# Patient Record
Sex: Male | Born: 1965 | Race: White | Hispanic: No | Marital: Married | Smoking: Never smoker
Health system: Southern US, Community
[De-identification: ages and names within clinical notes are randomized; demographics above are authoritative.]

## PROBLEM LIST (undated history)

## (undated) DIAGNOSIS — J45909 Unspecified asthma, uncomplicated: Secondary | ICD-10-CM

## (undated) DIAGNOSIS — J329 Chronic sinusitis, unspecified: Secondary | ICD-10-CM

## (undated) HISTORY — PX: SINOSCOPY: SHX187

## (undated) HISTORY — DX: Chronic sinusitis, unspecified: J32.9

## (undated) HISTORY — DX: Unspecified asthma, uncomplicated: J45.909

---

## 2015-08-02 ENCOUNTER — Ambulatory Visit: Payer: Self-pay | Admitting: Family Medicine

## 2015-10-18 ENCOUNTER — Ambulatory Visit (INDEPENDENT_AMBULATORY_CARE_PROVIDER_SITE_OTHER): Payer: Worker's Compensation | Admitting: Allergy and Immunology

## 2015-10-18 ENCOUNTER — Encounter: Payer: Self-pay | Admitting: Allergy and Immunology

## 2015-10-18 VITALS — BP 148/96 | HR 84 | Temp 97.9°F | Resp 16 | Ht 68.98 in | Wt 210.4 lb

## 2015-10-18 DIAGNOSIS — J339 Nasal polyp, unspecified: Secondary | ICD-10-CM | POA: Diagnosis not present

## 2015-10-18 DIAGNOSIS — H1010 Acute atopic conjunctivitis, unspecified eye: Secondary | ICD-10-CM | POA: Diagnosis not present

## 2015-10-18 DIAGNOSIS — J4541 Moderate persistent asthma with (acute) exacerbation: Secondary | ICD-10-CM

## 2015-10-18 DIAGNOSIS — J309 Allergic rhinitis, unspecified: Secondary | ICD-10-CM | POA: Diagnosis not present

## 2015-10-18 MED ORDER — FLUTICASONE FUROATE-VILANTEROL 200-25 MCG/INH IN AEPB
1.0000 | INHALATION_SPRAY | Freq: Every day | RESPIRATORY_TRACT | 5 refills | Status: DC
Start: 2015-10-18 — End: 2016-06-10

## 2015-10-18 MED ORDER — UMECLIDINIUM BROMIDE 62.5 MCG/INH IN AEPB: 1.0000 | INHALATION_SPRAY | Freq: Every day | RESPIRATORY_TRACT | 5 refills | Status: DC

## 2015-10-18 MED ORDER — MONTELUKAST SODIUM 10 MG PO TABS: 10.0000 mg | ORAL_TABLET | Freq: Every day | ORAL | 5 refills | Status: DC

## 2015-10-18 MED ORDER — MOMETASONE FUROATE 50 MCG/ACT NA SUSP: NASAL | 5 refills | Status: DC

## 2015-10-18 MED ORDER — ALBUTEROL SULFATE HFA 108 (90 BASE) MCG/ACT IN AERS: INHALATION_SPRAY | RESPIRATORY_TRACT | 1 refills | Status: DC

## 2015-10-18 NOTE — Progress Notes (Signed)
NEW PATIENT NOTE  Referring Provider: No ref. provider found Primary Provider: Hadley Pen, MD Date of office visit: 10/18/2015    Subjective:   Chief Complaint:  Nicolas Willis (DOB: 01-26-1966) is a 50 y.o. male with a chief complaint of Wheezing and Sinusitis  who presents to the clinic on 10/18/2015 with the following problems:  HPI: Nicolas Willis presents to this clinic in evaluation of respiratory tract problems. He has a long history of asthma and allergic rhinoconjunctivitis and chronic sinusitis and nasal polyps. He underwent a polypectomy and functional endoscopic sinus surgery in 2015 with a ear nose and throat doctor in [facility]. This helped for approximately 2 years but since working in a moldy environment in July 2016 he's had problems with both his head and chest. He has nasal congestion and stuffiness of his head and sneezing and headaches and coughing and wheezing and shortness of breath. When he exercises he has difficulty because of these issues. He's been treated with steroids about every other month which does help. His last steroid administration was about 3 weeks ago. He has been using Symbicort on a regular basis. He continues on Singulair on a regular basis.  Past Medical History:  Diagnosis Date  . Frequent sinus infections     Past Surgical History:  Procedure Laterality Date  . SINOSCOPY        Medication List      SYMBICORT 160-4.5 MCG/ACT inhaler Generic drug:  budesonide-formoterol Inhale 2 puffs into the lungs 2 (two) times daily.       No Known Allergies  Review of systems negative except as noted in HPI / PMHx or noted below:  Review of Systems  Constitutional: Negative.   HENT: Negative.   Eyes: Negative.   Respiratory: Negative.   Cardiovascular: Negative.   Gastrointestinal: Negative.   Genitourinary: Negative.   Musculoskeletal: Negative.   Skin: Negative.   Neurological: Negative.   Endo/Heme/Allergies: Negative.     Psychiatric/Behavioral: Negative.     Family History  Problem Relation Age of Onset  . Leukemia Mother   . Heart disease Maternal Grandmother   . Heart disease Maternal Grandfather     Social History   Social History  . Marital status: Married    Spouse name: N/A  . Number of children: N/A  . Years of education: N/A   Occupational History  . Not on file.   Social History Main Topics  . Smoking status: Never Smoker  . Smokeless tobacco: Never Used  . Alcohol use No  . Drug use: No  . Sexual activity: Not on file   Other Topics Concern  . Not on file   Social History Narrative  . No narrative on file    Environmental and Social history  Lives in a house with a dry environment, a dog located inside the household, carpeting in the bedroom, no plastic on the bed or pillow, and no smoking ongoing with inside the household. He is a Emergency planning/management officer.   Objective:   Vitals:   10/18/15 0856  BP: (!) 148/96  Pulse: 84  Resp: 16  Temp: 97.9 F (36.6 C)   Height: 5' 8.98" (175.2 cm) Weight: 210 lb 6.4 oz (95.4 kg)  Physical Exam  Constitutional: He is well-developed, well-nourished, and in no distress.  HENT:  Head: Normocephalic.  Right Ear: Tympanic membrane, external ear and ear canal normal.  Left Ear: Tympanic membrane, external ear and ear canal normal.  Nose: Mucosal edema (Possible  polyp formation deep in left nasal airway) present. No rhinorrhea.  Mouth/Throat: Uvula is midline, oropharynx is clear and moist and mucous membranes are normal. No oropharyngeal exudate.  Eyes: Conjunctivae are normal.  Neck: Trachea normal. No tracheal tenderness present. No tracheal deviation present. No thyromegaly present.  Cardiovascular: Normal rate, regular rhythm, S1 normal, S2 normal and normal heart sounds.   No murmur heard. Pulmonary/Chest: No stridor. No respiratory distress. He has wheezes (Bilateral expiratory wheezing). He has no rales.  Musculoskeletal: He  exhibits no edema.  Lymphadenopathy:       Head (right side): No tonsillar adenopathy present.       Head (left side): No tonsillar adenopathy present.    He has no cervical adenopathy.  Neurological: He is alert. Gait normal.  Skin: No rash noted. He is not diaphoretic. No erythema. Nails show no clubbing.  Psychiatric: Mood and affect normal.     Diagnostics: Allergy skin tests were performed. He demonstrated severe hypersensitivity against house dust mite, grass, weed, and trees  Spirometry was performed and demonstrated an FEV1 of 3.68 @ 95 % of predicted.Following the administration of nebulized albuterol his FEV1 rose to 3.85 which was an increase in the FEV1 of 5%.   Assessment and Plan:    1. Asthma, not well controlled, moderate persistent, with acute exacerbation   2. Allergic rhinoconjunctivitis   3. Nasal polyposis     1. Allergen avoidance measures - dehumidify work site  2. Treat and prevent inflammation:   A. Nasonex one spray each nostril twice a day  B. Breo 200 one inhalation 1 time per day. Sample. Coupon  C. Incruse one inhalation 1 time per day. Sample. Coupon  D. montelukast 10 mg one tablet once a day  3. If needed:   A. Proventil HFA 2 puffs every 4-6 hours  B. loratadine 10 mg one tablet one time per day  C. nasal saline wash  4. Start a course of immunotherapy  5. Check blood - CBC w/diff, IgE  6. 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 I will be regrouping with him in 3 weeks or earlier if there is a problem.  Laurette Schimke, MD [facility]

## 2015-10-18 NOTE — Patient Instructions (Signed)
  1. Allergen avoidance measures - dehumidify work site  2. Treat and prevent inflammation:   A. Nasonex one spray each nostril twice a day  B. Breo 200 one inhalation 1 time per day. Sample. Coupon  C. Incruse one inhalation 1 time per day. Sample. Coupon  D. montelukast 10 mg one tablet once a day  3. If needed:   A. Proventil HFA 2 puffs every 4-6 hours  B. loratadine 10 mg one tablet one time per day  C. nasal saline wash  4. Start a course of immunotherapy  5. Check blood - CBC w/diff, IgE  6. Return to clinic in 3 weeks or earlier if problem

## 2015-10-19 LAB — CBC WITH DIFFERENTIAL/PLATELET
Basophils Absolute: 0.1 10*3/uL (ref 0.0–0.2)
Basos: 1 %
EOS (ABSOLUTE): 0.9 10*3/uL — ABNORMAL HIGH (ref 0.0–0.4)
Eos: 13 %
Hematocrit: 45.1 % (ref 37.5–51.0)
Hemoglobin: 15.5 g/dL (ref 12.6–17.7)
Immature Grans (Abs): 0 10*3/uL (ref 0.0–0.1)
Immature Granulocytes: 0 %
Lymphocytes Absolute: 1.5 10*3/uL (ref 0.7–3.1)
Lymphs: 23 %
MCH: 30.9 pg (ref 26.6–33.0)
MCHC: 34.4 g/dL (ref 31.5–35.7)
MCV: 90 fL (ref 79–97)
Monocytes Absolute: 0.5 10*3/uL (ref 0.1–0.9)
Monocytes: 8 %
Neutrophils Absolute: 3.6 10*3/uL (ref 1.4–7.0)
Neutrophils: 55 %
Platelets: 252 10*3/uL (ref 150–379)
RBC: 5.01 x10E6/uL (ref 4.14–5.80)
RDW: 12 % — ABNORMAL LOW (ref 12.3–15.4)
WBC: 6.5 10*3/uL (ref 3.4–10.8)

## 2015-10-19 LAB — IGE: IgE (Immunoglobulin E), Serum: 216 [IU]/mL — ABNORMAL HIGH (ref 0–100)

## 2015-11-01 ENCOUNTER — Ambulatory Visit (INDEPENDENT_AMBULATORY_CARE_PROVIDER_SITE_OTHER): Payer: PRIVATE HEALTH INSURANCE | Admitting: *Deleted

## 2015-11-01 DIAGNOSIS — J4550 Severe persistent asthma, uncomplicated: Secondary | ICD-10-CM

## 2015-11-01 MED ORDER — MEPOLIZUMAB 100 MG ~~LOC~~ SOLR
100.0000 mg | SUBCUTANEOUS | Status: AC
  Administered 2015-11-01 – 2021-10-22 (×60): 100 mg via SUBCUTANEOUS

## 2015-11-01 MED ORDER — EPINEPHRINE 0.3 MG/0.3ML IJ SOAJ: INTRAMUSCULAR | 3 refills | Status: DC

## 2015-11-08 ENCOUNTER — Ambulatory Visit (INDEPENDENT_AMBULATORY_CARE_PROVIDER_SITE_OTHER): Payer: PRIVATE HEALTH INSURANCE | Admitting: Allergy and Immunology

## 2015-11-08 ENCOUNTER — Encounter: Payer: Self-pay | Admitting: Allergy and Immunology

## 2015-11-08 VITALS — BP 114/84 | HR 80 | Resp 20

## 2015-11-08 DIAGNOSIS — J4550 Severe persistent asthma, uncomplicated: Secondary | ICD-10-CM

## 2015-11-08 DIAGNOSIS — J309 Allergic rhinitis, unspecified: Secondary | ICD-10-CM

## 2015-11-08 DIAGNOSIS — J339 Nasal polyp, unspecified: Secondary | ICD-10-CM | POA: Diagnosis not present

## 2015-11-08 DIAGNOSIS — H1010 Acute atopic conjunctivitis, unspecified eye: Secondary | ICD-10-CM | POA: Diagnosis not present

## 2015-11-08 NOTE — Patient Instructions (Addendum)
  1. Continue to perform Allergen avoidance measures and dehumidify work site  2. Continue to Treat and prevent inflammation:   A. Nasonex one spray each nostril twice a day  B. Breo 200 one inhalation 1 time per day.    C. Incruse one inhalation 1 time per day.    D. montelukast 10 mg one tablet once a day  3. If needed:   A. Proventil HFA 2 puffs every 4-6 hours  B. loratadine 10 mg one tablet one time per day  C. nasal saline wash  4. Continue nucala and EpiPen .   5. Start a course of immunotherapy  6. Obtain fall flu vaccine  7. Return to clinic in 8 weeks or earlier if problem

## 2015-11-08 NOTE — Progress Notes (Signed)
Follow-up Note  Referring Provider: Hadley Penobbins, Robert A, MD Primary Provider: Hadley PenOBBINS,ROBERT A, MD Date of Office Visit: 11/08/2015  Subjective:   Nicolas Willis (DOB: 12/12/1965) is a 50 y.o. male who [facility]2015 in re-evaluation of the following:  HPI: Nicolas Willis presents to this clinic in evaluation of his severe asthma, allergic rhinoconjunctivitis, reflux-induced respiratory disease, and possible nasal polyposis with a history of functional endoscopic sinus surgery in 2015.  While utilizing therapy initiated on 10/18/2015 he has had an excellent response. He has no wheezing and coughing and no nasal congestion and no head fullness and no throat clearing and no postnasal drip and no issues with reflux. He does not need to use a short acting bronchodilator. He's been consistently using all medical therapy established during his initial evaluation of 10/18/2015. He started nucala on 11/01/2015.    Medication List      albuterol 108 (90 Base) MCG/ACT inhaler Commonly known as:  PROVENTIL HFA Inhale two puffs every four to six hours as needed for cough or wheeze.   EPINEPHrine 0.3 mg/0.3 mL Soaj injection Commonly known as:  EPIPEN 2-PAK Use as directed for life threatening allergic reactions   fluticasone furoate-vilanterol 200-25 MCG/INH Aepb Commonly known as:  BREO ELLIPTA Inhale 1 puff into the lungs daily. Rinse, gargle, and spit after use.   loratadine 10 MG tablet Commonly known as:  CLARITIN Take 10 mg by mouth daily as needed for allergies.   mometasone 50 MCG/ACT nasal spray Commonly known as:  NASONEX Use one spray in each nostril twice a day.   montelukast 10 MG tablet Commonly known as:  SINGULAIR Take 1 tablet (10 mg total) by mouth daily.   umeclidinium bromide 62.5 MCG/INH Aepb Commonly known as:  INCRUSE ELLIPTA Inhale 1 puff into the lungs daily.       Past Medical History:  Diagnosis Date  . Frequent  sinus infections     Past Surgical History:  Procedure Laterality Date  . SINOSCOPY      No Known Allergies  Review of systems negative except as noted in HPI / PMHx or noted below:  Review of Systems  Constitutional: Negative.   HENT: Negative.   Eyes: Negative.   Respiratory: Negative.   Cardiovascular: Negative.   Gastrointestinal: Negative.   Genitourinary: Negative.   Musculoskeletal: Negative.   Skin: Negative.   Neurological: Negative.   Endo/Heme/Allergies: Negative.   Psychiatric/Behavioral: Negative.      Objective:   Vitals:   11/08/15 1111  BP: 114/84  Pulse: 80  Resp: 20          Physical Exam  Constitutional: He is well-developed, well-nourished, and in no distress.  HENT:  Head: Normocephalic.  Right Ear: Tympanic membrane, external ear and ear canal normal.  Left Ear: Tympanic membrane, external ear and ear canal normal.  Nose: Nose normal. No mucosal edema or rhinorrhea.  Mouth/Throat: Uvula is midline, oropharynx is clear and moist and mucous membranes are normal. No oropharyngeal exudate.  Very enlarged left inferior turbinate  Eyes: Conjunctivae are normal.  Neck: Trachea normal. No tracheal tenderness present. No tracheal deviation present. No thyromegaly present.  Cardiovascular: Normal rate, regular rhythm, S1 normal, S2 normal and normal heart sounds.   No murmur heard. Pulmonary/Chest: Breath sounds normal. No stridor. No respiratory distress. He has no wheezes. He has no rales.  Musculoskeletal: He exhibits no edema.  Lymphadenopathy:       Head (right side): No  tonsillar adenopathy present.       Head (left side): No tonsillar adenopathy present.    He has no cervical adenopathy.  Neurological: He is alert. Gait normal.  Skin: No rash noted. He is not diaphoretic. No erythema. Nails show no clubbing.  Psychiatric: Mood and affect normal.    Diagnostics:    Spirometry was performed and demonstrated an FEV1 of 4.14 at 107 % of  predicted.  The patient had an Asthma Control Test with the following results: ACT Total Score: 24.    Assessment and Plan:   1. Asthma, severe persistent, well-controlled   2. Allergic rhinoconjunctivitis   3. Nasal polyposis     1. Continue to perform Allergen avoidance measures and dehumidify work site  2. Continue to Treat and prevent inflammation:   A. Nasonex one spray each nostril twice a day  B. Breo 200 one inhalation 1 time per day.    C. Incruse one inhalation 1 time per day.    D. montelukast 10 mg one tablet once a day  3. If needed:   A. Proventil HFA 2 puffs every 4-6 hours  B. loratadine 10 mg one tablet one time per day  C. nasal saline wash  4. Continue nucala and EpiPen    5. Start a course of immunotherapy  6. Obtain fall flu vaccine  7. Return to clinic in 8 weeks or earlier if problem  Nicolas Willis is doing much better and I suspect that with his mepolizumab administration will be able to consolidate his medical treatment within the next 8 weeks or so. For now he'll remain on a very large collection of medical treatment. I did encourage him to start a course of immunotherapy as a long-term approach to his atopic disease and he is presently considering that option. He will contact me during the interval should there be a significant problem  Laurette SchimkeEric Laraina Sulton, MD [facility]

## 2015-11-29 ENCOUNTER — Ambulatory Visit (INDEPENDENT_AMBULATORY_CARE_PROVIDER_SITE_OTHER): Payer: PRIVATE HEALTH INSURANCE | Admitting: *Deleted

## 2015-11-29 DIAGNOSIS — J4550 Severe persistent asthma, uncomplicated: Secondary | ICD-10-CM

## 2015-12-25 ENCOUNTER — Telehealth: Payer: Self-pay | Admitting: Allergy and Immunology

## 2015-12-25 NOTE — Telephone Encounter (Signed)
I have talked to pt's wife.  [facility]

## 2015-12-25 NOTE — Telephone Encounter (Signed)
Patient wife is concerned about [facility] that is supposed to be a workers comp. Please call her back at her home phone number [phone]. Thank you

## 2015-12-27 ENCOUNTER — Ambulatory Visit (INDEPENDENT_AMBULATORY_CARE_PROVIDER_SITE_OTHER): Payer: Self-pay | Admitting: *Deleted

## 2015-12-27 DIAGNOSIS — J4550 Severe persistent asthma, uncomplicated: Secondary | ICD-10-CM

## 2016-01-03 ENCOUNTER — Encounter: Payer: Self-pay | Admitting: Allergy and Immunology

## 2016-01-03 ENCOUNTER — Ambulatory Visit (INDEPENDENT_AMBULATORY_CARE_PROVIDER_SITE_OTHER): Payer: PRIVATE HEALTH INSURANCE | Admitting: Allergy and Immunology

## 2016-01-03 VITALS — BP 150/100 | HR 92 | Resp 16

## 2016-01-03 DIAGNOSIS — H1010 Acute atopic conjunctivitis, unspecified eye: Secondary | ICD-10-CM

## 2016-01-03 DIAGNOSIS — J339 Nasal polyp, unspecified: Secondary | ICD-10-CM | POA: Diagnosis not present

## 2016-01-03 DIAGNOSIS — J4550 Severe persistent asthma, uncomplicated: Secondary | ICD-10-CM | POA: Diagnosis not present

## 2016-01-03 DIAGNOSIS — J309 Allergic rhinitis, unspecified: Secondary | ICD-10-CM

## 2016-01-03 NOTE — Progress Notes (Signed)
Follow-up Note  Referring Provider: Hadley Pen, MD Primary Provider: Hadley Pen, MD Date of Office Visit: 01/03/2016  Subjective:   Nicolas Willis (DOB: [birth year]) is a 50 y.o. male who [facility] 01/03/2016 in re-evaluation of the following:  HPI: Rockney presents to this clinic in evaluation of his severe asthma treated with mepolizumab, allergic rhinoconjunctivitis, possible nasal polyposis and history of functional endoscopic sinus surgery in 2015 and reflux-induced respiratory disease. I last saw him in his clinic in August 2017.  During the interval he has had no respiratory tract symptoms. He can exercise without any difficulty and does not use a short acting bronchodilator and has not required a systemic steroid. He has no problems with his nose and can smell without any difficulty and has not required and antibiotic for an episode of sinusitis. He has no reflux symptoms.    Medication List      albuterol 108 (90 Base) MCG/ACT inhaler Commonly known as:  PROVENTIL HFA Inhale two puffs every four to six hours as needed for cough or wheeze.   EPINEPHrine 0.3 mg/0.3 mL Soaj injection Commonly known as:  EPIPEN 2-PAK Use as directed for life threatening allergic reactions   fluticasone furoate-vilanterol 200-25 MCG/INH Aepb Commonly known as:  BREO ELLIPTA Inhale 1 puff into the lungs daily. Rinse, gargle, and spit after use.   loratadine 10 MG tablet Commonly known as:  CLARITIN Take 10 mg by mouth daily as needed for allergies.   mometasone 50 MCG/ACT nasal spray Commonly known as:  NASONEX Use one spray in each nostril twice a day.   montelukast 10 MG tablet Commonly known as:  SINGULAIR Take 1 tablet (10 mg total) by mouth daily.   umeclidinium bromide 62.5 MCG/INH Aepb Commonly known as:  INCRUSE ELLIPTA Inhale 1 puff into the lungs daily.       Past Medical History:  Diagnosis Date  . Frequent sinus  infections     Past Surgical History:  Procedure Laterality Date  . SINOSCOPY      No Known Allergies  Review of systems negative except as noted in HPI / PMHx or noted below:  Review of Systems  Constitutional: Negative.   HENT: Negative.   Eyes: Negative.   Respiratory: Negative.   Cardiovascular: Negative.   Gastrointestinal: Negative.   Genitourinary: Negative.   Musculoskeletal: Negative.   Skin: Negative.   Neurological: Negative.   Endo/Heme/Allergies: Negative.   Psychiatric/Behavioral: Negative.      Objective:   Vitals:   01/03/16 0844  BP: (!) 150/100  Pulse: 92  Resp: 16          Physical Exam  Constitutional: He is well-developed, well-nourished, and in no distress.  HENT:  Head: Normocephalic.  Right Ear: Tympanic membrane, external ear and ear canal normal.  Left Ear: Tympanic membrane, external ear and ear canal normal.  Nose: Nose normal. No mucosal edema or rhinorrhea.  Mouth/Throat: Uvula is midline, oropharynx is clear and moist and mucous membranes are normal. No oropharyngeal exudate.  Eyes: Conjunctivae are normal.  Neck: Trachea normal. No tracheal tenderness present. No tracheal deviation present. No thyromegaly present.  Cardiovascular: Normal rate, regular rhythm, S1 normal, S2 normal and normal heart sounds.   No murmur heard. Pulmonary/Chest: Breath sounds normal. No stridor. No respiratory distress. He has no wheezes. He has no rales.  Musculoskeletal: He exhibits no edema.  Lymphadenopathy:       Head (right side): No tonsillar  adenopathy present.       Head (left side): No tonsillar adenopathy present.    He has no cervical adenopathy.  Neurological: He is alert. Gait normal.  Skin: No rash noted. He is not diaphoretic. No erythema. Nails show no clubbing.  Psychiatric: Mood and affect normal.    Diagnostics:    Spirometry was performed and demonstrated an FEV1 of 3.84 at 102 % of predicted.  The patient had an Asthma  Control Test with the following results: ACT Total Score: 24.    Assessment and Plan:   1. Severe persistent asthma without complication   2. Asthma, severe persistent, well-controlled   3. Allergic rhinoconjunctivitis   4. Nasal polyposis      1. Continue to perform Allergen avoidance measures   2. Continue to Treat and prevent inflammation:   A. Decrease Nasonex one spray each nostril Monday - Friday  B. Decrease Breo 200 one inhalation Monday - Friday  C. Discontinue Incruse one inhalation Monday - Friday   D. Decrease montelukast 10 mg one tablet Monday - Friday  3. If needed:   A. Proventil HFA 2 puffs every 4-6 hours  B. loratadine 10 mg one tablet one time per day  C. nasal saline wash  4. Continue nucala and EpiPen .   5. Return to clinic in 12 weeks or earlier if problem. Taper?  [place] and has done wonderful since we started him on mepolizumab and I think there is an opportunity to consolidate his medical therapy and we will decrease his anti-inflammatory agents for his respiratory tract by 20% today. I will see him back in this clinic in 12 weeks and if he continues to do well we will further taper down his medications. He refuses to receive the flu vaccine.  Laurette SchimkeEric Kozlow, MD [facility]

## 2016-01-03 NOTE — Patient Instructions (Signed)
    1. Continue to perform Allergen avoidance measures   2. Continue to Treat and prevent inflammation:   A. Decrease Nasonex one spray each nostril Monday - Friday  B. Decrease Breo 200 one inhalation Monday - Friday  C. Discontinue Incruse one inhalation Monday - Friday   D. Decrease montelukast 10 mg one tablet Monday - Friday  3. If needed:   A. Proventil HFA 2 puffs every 4-6 hours  B. loratadine 10 mg one tablet one time per day  C. nasal saline wash  4. Continue nucala and EpiPen .   5. Return to clinic in 12 weeks or earlier if problem. Taper?

## 2016-01-24 ENCOUNTER — Ambulatory Visit (INDEPENDENT_AMBULATORY_CARE_PROVIDER_SITE_OTHER): Payer: PRIVATE HEALTH INSURANCE | Admitting: *Deleted

## 2016-01-24 DIAGNOSIS — J4550 Severe persistent asthma, uncomplicated: Secondary | ICD-10-CM | POA: Diagnosis not present

## 2016-02-21 ENCOUNTER — Ambulatory Visit (INDEPENDENT_AMBULATORY_CARE_PROVIDER_SITE_OTHER): Payer: PRIVATE HEALTH INSURANCE

## 2016-02-21 DIAGNOSIS — J4550 Severe persistent asthma, uncomplicated: Secondary | ICD-10-CM | POA: Diagnosis not present

## 2016-02-26 ENCOUNTER — Encounter: Payer: Self-pay | Admitting: Allergy and Immunology

## 2016-03-24 ENCOUNTER — Ambulatory Visit (INDEPENDENT_AMBULATORY_CARE_PROVIDER_SITE_OTHER): Payer: PRIVATE HEALTH INSURANCE | Admitting: *Deleted

## 2016-03-24 DIAGNOSIS — J4550 Severe persistent asthma, uncomplicated: Secondary | ICD-10-CM | POA: Diagnosis not present

## 2016-03-27 ENCOUNTER — Ambulatory Visit: Payer: PRIVATE HEALTH INSURANCE | Admitting: Allergy and Immunology

## 2016-03-31 ENCOUNTER — Ambulatory Visit (INDEPENDENT_AMBULATORY_CARE_PROVIDER_SITE_OTHER): Payer: PRIVATE HEALTH INSURANCE | Admitting: Allergy and Immunology

## 2016-03-31 ENCOUNTER — Encounter: Payer: Self-pay | Admitting: Allergy and Immunology

## 2016-03-31 VITALS — BP 140/100 | HR 80 | Resp 20

## 2016-03-31 DIAGNOSIS — J309 Allergic rhinitis, unspecified: Secondary | ICD-10-CM

## 2016-03-31 DIAGNOSIS — J339 Nasal polyp, unspecified: Secondary | ICD-10-CM

## 2016-03-31 DIAGNOSIS — H1010 Acute atopic conjunctivitis, unspecified eye: Secondary | ICD-10-CM | POA: Diagnosis not present

## 2016-03-31 DIAGNOSIS — J4550 Severe persistent asthma, uncomplicated: Secondary | ICD-10-CM | POA: Diagnosis not present

## 2016-03-31 NOTE — Patient Instructions (Signed)
    1. Continue to perform Allergen avoidance measures   2. Continue to Treat and prevent inflammation:   A. Continue Nasonex one spray each nostril Monday - Friday  B. Continue Breo 200 one inhalation Monday - Friday  C. Continue  montelukast 10 mg one tablet Monday - Friday  3. If needed:   A. Proventil HFA 2 puffs every 4-6 hours  B. loratadine 10 mg one tablet one time per day  C. nasal saline wash  4. Continue nucala and EpiPen .   5. Return to clinic in 12 weeks or earlier if problem. Taper?

## 2016-03-31 NOTE — Progress Notes (Signed)
Follow-up Note  Referring Provider: Hadley Pen, MD Primary Provider: Hadley Pen, MD Date of Office Visit: 03/31/2016  Subjective:   Nicolas Willis (DOB: [birth year]) is a 51 y.o. male who [facility] on 03/31/2016 in re-evaluation of the following:  HPI: Amato presents to this clinic in reevaluation of his asthma treated with mepolizumab, allergic rhinitis, history of functional endoscopic sinus surgery and apparent nasal polyposis and reflux-induced respiratory disease. I last saw him in his clinic in October 2017.  He has done wonderful. His respiratory tract issues have completely melted away on his current medical therapy which includes anti-inflammatory medications administered to his respiratory tract and consistent use of mepolizumab. He can exercise without any difficulty. He has not had any significant upper airway symptoms and he can now smell. He has not required a systemic steroid or an antibiotic since I've last seen him in this clinic.   Allergies as of 03/31/2016   No Known Allergies     Medication List      albuterol 108 (90 Base) MCG/ACT inhaler Commonly known as:  PROVENTIL HFA Inhale two puffs every four to six hours as needed for cough or wheeze.   EPINEPHrine 0.3 mg/0.3 mL Soaj injection Commonly known as:  EPIPEN 2-PAK Use as directed for life threatening allergic reactions   fluticasone furoate-vilanterol 200-25 MCG/INH Aepb Commonly known as:  BREO ELLIPTA Inhale 1 puff into the lungs daily. Rinse, gargle, and spit after use.   loratadine 10 MG tablet Commonly known as:  CLARITIN Take 10 mg by mouth daily as needed for allergies.   mometasone 50 MCG/ACT nasal spray Commonly known as:  NASONEX Use one spray in each nostril twice a day.   montelukast 10 MG tablet Commonly known as:  SINGULAIR Take 1 tablet (10 mg total) by mouth daily.   umeclidinium bromide 62.5 MCG/INH Aepb Commonly known as:   INCRUSE ELLIPTA Inhale 1 puff into the lungs daily.       Past Medical History:  Diagnosis Date  . Frequent sinus infections     Past Surgical History:  Procedure Laterality Date  . SINOSCOPY      Review of systems negative except as noted in HPI / PMHx or noted below:  Review of Systems  Constitutional: Negative.   HENT: Negative.   Eyes: Negative.   Respiratory: Negative.   Cardiovascular: Negative.   Gastrointestinal: Negative.   Genitourinary: Negative.   Musculoskeletal: Negative.   Skin: Negative.   Neurological: Negative.   Endo/Heme/Allergies: Negative.   Psychiatric/Behavioral: Negative.      Objective:   Vitals:   03/31/16 1018  BP: (!) 140/100  Pulse: 80  Resp: 20          Physical Exam  Constitutional: He is well-developed, well-nourished, and in no distress.  HENT:  Head: Normocephalic.  Right Ear: Tympanic membrane, external ear and ear canal normal.  Left Ear: Tympanic membrane, external ear and ear canal normal.  Nose: Nose normal. No mucosal edema or rhinorrhea.  Mouth/Throat: Uvula is midline, oropharynx is clear and moist and mucous membranes are normal. No oropharyngeal exudate.  Eyes: Conjunctivae are normal.  Neck: Trachea normal. No tracheal tenderness present. No tracheal deviation present. No thyromegaly present.  Cardiovascular: Normal rate, regular rhythm, S1 normal, S2 normal and normal heart sounds.   No murmur heard. Pulmonary/Chest: Breath sounds normal. No stridor. No respiratory distress. He has no wheezes. He has no rales.  Musculoskeletal: He exhibits no  edema.  Lymphadenopathy:       Head (right side): No tonsillar adenopathy present.       Head (left side): No tonsillar adenopathy present.    He has no cervical adenopathy.  Neurological: He is alert. Gait normal.  Skin: No rash noted. He is not diaphoretic. No erythema. Nails show no clubbing.  Psychiatric: Mood and affect normal.    Diagnostics:    Spirometry  was performed and demonstrated an FEV1 of 3.88 at 103 % of predicted.  The patient had an Asthma Control Test with the following results: ACT Total Score: 24.    Assessment and Plan:   1. Severe persistent asthma without complication   2. Allergic rhinoconjunctivitis   3. Nasal polyposis      1. Continue to perform Allergen avoidance measures   2. Continue to Treat and prevent inflammation:   A. Continue Nasonex one spray each nostril Monday - Friday  B. Continue Breo 200 one inhalation Monday - Friday  C. Continue  montelukast 10 mg one tablet Monday - Friday  3. If needed:   A. Proventil HFA 2 puffs every 4-6 hours  B. loratadine 10 mg one tablet one time per day  C. nasal saline wash  4. Continue nucala and EpiPen .   5. Return to clinic in 12 weeks or earlier if problem. Taper?  Kwabena appears to be doing quite well with his mepolizumab infusions as well as anti-inflammatory medications for his respiratory tract and we'll keep him on this plan and see him back in this clinic in approximately 12 weeks or earlier if there is a problem. There may be an opportunity to further taper his medications at that point.  Laurette SchimkeEric Ediel Unangst, MD [facility]

## 2016-04-15 ENCOUNTER — Telehealth: Payer: Self-pay | Admitting: *Deleted

## 2016-04-15 NOTE — Telephone Encounter (Signed)
PT WAS UNDER THE IMPRESSION THAT [employer] HAD PAID FOR THE VISITS. PT WIFE JUST RECEIVED A BILL STATING THAT THEY HAVE AN OUTSTANDING BALANCE AND WILL BE TRUEND OVER TO COLLECTIONS. WIFE WANTS A RETURN CALL ASAP AT [phone] (BETWEEN [phone]).

## 2016-04-15 NOTE — Telephone Encounter (Signed)
Candice has been trying to get in touch with patient about W/C & they have not called her back - she will call Nicolas Willis

## 2016-04-21 ENCOUNTER — Ambulatory Visit (INDEPENDENT_AMBULATORY_CARE_PROVIDER_SITE_OTHER): Payer: PRIVATE HEALTH INSURANCE | Admitting: *Deleted

## 2016-04-21 DIAGNOSIS — J4550 Severe persistent asthma, uncomplicated: Secondary | ICD-10-CM | POA: Diagnosis not present

## 2016-05-19 ENCOUNTER — Ambulatory Visit (INDEPENDENT_AMBULATORY_CARE_PROVIDER_SITE_OTHER): Payer: PRIVATE HEALTH INSURANCE | Admitting: *Deleted

## 2016-05-19 DIAGNOSIS — J4550 Severe persistent asthma, uncomplicated: Secondary | ICD-10-CM

## 2016-06-10 ENCOUNTER — Other Ambulatory Visit: Payer: Self-pay | Admitting: *Deleted

## 2016-06-10 MED ORDER — FLUTICASONE FUROATE-VILANTEROL 200-25 MCG/INH IN AEPB: 1.0000 | INHALATION_SPRAY | Freq: Every day | RESPIRATORY_TRACT | 5 refills | Status: DC

## 2016-06-11 ENCOUNTER — Other Ambulatory Visit: Payer: Self-pay | Admitting: *Deleted

## 2016-06-11 MED ORDER — FLUTICASONE FUROATE-VILANTEROL 200-25 MCG/INH IN AEPB: 1.0000 | INHALATION_SPRAY | Freq: Every day | RESPIRATORY_TRACT | 0 refills | Status: DC

## 2016-06-17 ENCOUNTER — Ambulatory Visit (INDEPENDENT_AMBULATORY_CARE_PROVIDER_SITE_OTHER): Payer: PRIVATE HEALTH INSURANCE | Admitting: *Deleted

## 2016-06-17 DIAGNOSIS — J4550 Severe persistent asthma, uncomplicated: Secondary | ICD-10-CM | POA: Diagnosis not present

## 2016-06-30 ENCOUNTER — Ambulatory Visit (INDEPENDENT_AMBULATORY_CARE_PROVIDER_SITE_OTHER): Payer: PRIVATE HEALTH INSURANCE | Admitting: Allergy and Immunology

## 2016-06-30 ENCOUNTER — Encounter: Payer: Self-pay | Admitting: Allergy and Immunology

## 2016-06-30 VITALS — BP 120/90 | HR 92 | Resp 18

## 2016-06-30 DIAGNOSIS — J339 Nasal polyp, unspecified: Secondary | ICD-10-CM

## 2016-06-30 DIAGNOSIS — J309 Allergic rhinitis, unspecified: Secondary | ICD-10-CM | POA: Diagnosis not present

## 2016-06-30 DIAGNOSIS — J4550 Severe persistent asthma, uncomplicated: Secondary | ICD-10-CM | POA: Diagnosis not present

## 2016-06-30 DIAGNOSIS — H1010 Acute atopic conjunctivitis, unspecified eye: Secondary | ICD-10-CM

## 2016-06-30 NOTE — Patient Instructions (Addendum)
    1. Continue to perform Allergen avoidance measures   2. Continue to Treat and prevent inflammation:   A. DECREASE Nasonex one spray each nostril Monday - Wednesday - Friday  B. DECREASE Breo 200 one inhalation Monday - Wednesday - Friday  C. DECREASE montelukast 10 mg one tablet Monday - Wednesday - Friday  3. If needed:   A. Proventil HFA 2 puffs every 4-6 hours  B. loratadine 10 mg one tablet one time per day  C. nasal saline wash  4. Continue nucala and EpiPen .   5. Return to clinic in 12 weeks or earlier if problem. Taper?

## 2016-06-30 NOTE — Progress Notes (Signed)
Follow-up Note  Referring Provider: Hadley Pen, MD Primary Provider: Hadley Pen, MD Date of Office Visit: 06/30/2016  Subjective:   Nicolas Willis (DOB: 12-Mar-1966) is a 51 y.o. male [facility] in re-evaluation of the following:  HPI: Nicolas Willis returns to this clinic in reevaluation of his asthma and allergic rhinitis and history of nasal polyposis and reflux-induced respiratory disease. His last visit to this clinic was January 2018.  Once again he has done excellent on his current medical therapy. During his last visit we decreased his inhaled steroid and nasal steroid to Monday through Friday use. He has no problems with his airway and can exercise without any difficulty and does not use a short acting bronchodilator. He can smell without any difficulty at this point in time. He has not required a systemic steroid or antibiotic during the interval. Reflux has not been an issue at all.  Allergies as of 06/30/2016   No Known Allergies     Medication List      albuterol 108 (90 Base) MCG/ACT inhaler Commonly known as:  PROVENTIL HFA Inhale two puffs every four to six hours as needed for cough or wheeze.   EPINEPHrine 0.3 mg/0.3 mL Soaj injection Commonly known as:  EPIPEN 2-PAK Use as directed for life threatening allergic reactions   fluticasone furoate-vilanterol 200-25 MCG/INH Aepb Commonly known as:  BREO ELLIPTA Inhale 1 puff into the lungs daily. Rinse, gargle, and spit after use.   loratadine 10 MG tablet Commonly known as:  CLARITIN Take 10 mg by mouth daily as needed for allergies.   mometasone 50 MCG/ACT nasal spray Commonly known as:  NASONEX Use one spray in each nostril twice a day.   montelukast 10 MG tablet Commonly known as:  SINGULAIR Take 1 tablet (10 mg total) by mouth daily.       Past Medical History:  Diagnosis Date  . Frequent sinus infections     Past Surgical History:    Procedure Laterality Date  . SINOSCOPY      Review of systems negative except as noted in HPI / PMHx or noted below:  Review of Systems  Constitutional: Negative.   HENT: Negative.   Eyes: Negative.   Respiratory: Negative.   Cardiovascular: Negative.   Gastrointestinal: Negative.   Genitourinary: Negative.   Musculoskeletal: Negative.   Skin: Negative.   Neurological: Negative.   Endo/Heme/Allergies: Negative.   Psychiatric/Behavioral: Negative.      Objective:   Vitals:   06/30/16 1006  BP: 120/90  Pulse: 92  Resp: 18          Physical Exam  Constitutional: He is well-developed, well-nourished, and in no distress.  HENT:  Head: Normocephalic.  Right Ear: Tympanic membrane, external ear and ear canal normal.  Left Ear: Tympanic membrane, external ear and ear canal normal.  Nose: Nose normal. No mucosal edema or rhinorrhea.  Mouth/Throat: Uvula is midline, oropharynx is clear and moist and mucous membranes are normal. No oropharyngeal exudate.  Eyes: Conjunctivae are normal.  Neck: Trachea normal. No tracheal tenderness present. No tracheal deviation present. No thyromegaly present.  Cardiovascular: Normal rate, regular rhythm, S1 normal, S2 normal and normal heart sounds.   No murmur heard. Pulmonary/Chest: Breath sounds normal. No stridor. No respiratory distress. He has no wheezes. He has no rales.  Musculoskeletal: He exhibits no edema.  Lymphadenopathy:       Head (right side): No tonsillar adenopathy present.  Head (left side): No tonsillar adenopathy present.    He has no cervical adenopathy.  Neurological: He is alert. Gait normal.  Skin: No rash noted. He is not diaphoretic. No erythema. Nails show no clubbing.  Psychiatric: Mood and affect normal.    Diagnostics:    Spirometry was performed and demonstrated an FEV1 of 3.89 at 104 % of predicted.  The patient had an Asthma Control Test with the following results: ACT Total Score: 25.     Assessment and Plan:   1. Severe persistent asthma without complication   2. Allergic rhinoconjunctivitis   3. Nasal polyposis      1. Continue to perform Allergen avoidance measures   2. Continue to Treat and prevent inflammation:   A. DECREASE Nasonex one spray each nostril Monday - Wednesday - Friday  B. DECREASE Breo 200 one inhalation Monday - Wednesday - Friday  C. DECREASE montelukast 10 mg one tablet Monday - Wednesday - Friday  3. If needed:   A. Proventil HFA 2 puffs every 4-6 hours  B. loratadine 10 mg one tablet one time per day  C. nasal saline wash  4. Continue nucala and EpiPen .   5. Return to clinic in 12 weeks or earlier if problem. Taper?  Nicolas Willis appears to be doing excellent and he has really done well ever since he started his anti-[place] 5 biological agent. I think today there is an opportunity to lower his medications as noted above. I will see him back in this clinic in 12 weeks and if he continues to do well I will make an attempt to eliminate his inhaled steroid. He needs to remain on some dose of Nasonex and montelukast given his history of nasal polyposis and functional endoscopic sinus surgery.  Laurette Schimke, MD Allergy / Immunology [facility]

## 2016-07-15 ENCOUNTER — Ambulatory Visit (INDEPENDENT_AMBULATORY_CARE_PROVIDER_SITE_OTHER): Payer: PRIVATE HEALTH INSURANCE | Admitting: *Deleted

## 2016-07-15 DIAGNOSIS — J4550 Severe persistent asthma, uncomplicated: Secondary | ICD-10-CM

## 2016-08-12 ENCOUNTER — Ambulatory Visit (INDEPENDENT_AMBULATORY_CARE_PROVIDER_SITE_OTHER): Payer: PRIVATE HEALTH INSURANCE | Admitting: *Deleted

## 2016-08-12 DIAGNOSIS — J4550 Severe persistent asthma, uncomplicated: Secondary | ICD-10-CM

## 2016-08-21 ENCOUNTER — Other Ambulatory Visit: Payer: Self-pay | Admitting: *Deleted

## 2016-08-21 MED ORDER — MONTELUKAST SODIUM 10 MG PO TABS
10.0000 mg | ORAL_TABLET | Freq: Every day | ORAL | 5 refills | Status: DC
Start: 2016-08-21 — End: 2019-02-21

## 2016-09-11 ENCOUNTER — Ambulatory Visit (INDEPENDENT_AMBULATORY_CARE_PROVIDER_SITE_OTHER): Payer: PRIVATE HEALTH INSURANCE | Admitting: *Deleted

## 2016-09-11 DIAGNOSIS — J4550 Severe persistent asthma, uncomplicated: Secondary | ICD-10-CM | POA: Diagnosis not present

## 2016-09-22 ENCOUNTER — Ambulatory Visit: Payer: PRIVATE HEALTH INSURANCE | Admitting: Allergy and Immunology

## 2016-09-22 DIAGNOSIS — J309 Allergic rhinitis, unspecified: Secondary | ICD-10-CM

## 2016-10-09 ENCOUNTER — Ambulatory Visit (INDEPENDENT_AMBULATORY_CARE_PROVIDER_SITE_OTHER): Payer: PRIVATE HEALTH INSURANCE | Admitting: *Deleted

## 2016-10-09 DIAGNOSIS — J4550 Severe persistent asthma, uncomplicated: Secondary | ICD-10-CM | POA: Diagnosis not present

## 2016-11-10 ENCOUNTER — Ambulatory Visit (INDEPENDENT_AMBULATORY_CARE_PROVIDER_SITE_OTHER): Payer: PRIVATE HEALTH INSURANCE | Admitting: *Deleted

## 2016-11-10 DIAGNOSIS — J4550 Severe persistent asthma, uncomplicated: Secondary | ICD-10-CM

## 2017-01-19 ENCOUNTER — Telehealth: Payer: Self-pay | Admitting: *Deleted

## 2017-01-19 NOTE — Telephone Encounter (Signed)
Mailed out as requested

## 2017-01-19 NOTE — Telephone Encounter (Signed)
Patient would like a detailed statement starting back from January 2018 mailed to him.

## 2017-06-10 ENCOUNTER — Other Ambulatory Visit: Payer: Self-pay

## 2017-06-10 MED ORDER — FLUTICASONE FUROATE-VILANTEROL 200-25 MCG/INH IN AEPB: 1.0000 | INHALATION_SPRAY | Freq: Every day | RESPIRATORY_TRACT | 0 refills | Status: DC

## 2017-06-10 MED FILL — BREO ELLIPTA 200-25 MCG INH: 200-25 | 30 days supply | Qty: 60 | Fill #0

## 2017-06-18 DIAGNOSIS — Z6828 Body mass index (BMI) 28.0-28.9, adult: Secondary | ICD-10-CM | POA: Diagnosis not present

## 2017-06-18 DIAGNOSIS — J45901 Unspecified asthma with (acute) exacerbation: Secondary | ICD-10-CM | POA: Diagnosis not present

## 2017-06-18 DIAGNOSIS — J309 Allergic rhinitis, unspecified: Secondary | ICD-10-CM | POA: Diagnosis not present

## 2017-07-02 DIAGNOSIS — J330 Polyp of nasal cavity: Secondary | ICD-10-CM | POA: Diagnosis not present

## 2017-07-02 DIAGNOSIS — Z6828 Body mass index (BMI) 28.0-28.9, adult: Secondary | ICD-10-CM | POA: Diagnosis not present

## 2017-07-02 DIAGNOSIS — Z1339 Encounter for screening examination for other mental health and behavioral disorders: Secondary | ICD-10-CM | POA: Diagnosis not present

## 2017-07-02 DIAGNOSIS — Z1331 Encounter for screening for depression: Secondary | ICD-10-CM | POA: Diagnosis not present

## 2017-07-02 DIAGNOSIS — J45901 Unspecified asthma with (acute) exacerbation: Secondary | ICD-10-CM | POA: Diagnosis not present

## 2017-07-13 MED FILL — BREO ELLIPTA 200-25 MCG INH: 200-25 | 30 days supply | Qty: 60 | Fill #0

## 2018-02-24 ENCOUNTER — Encounter: Payer: Self-pay | Admitting: Allergy and Immunology

## 2018-02-24 ENCOUNTER — Ambulatory Visit: Payer: Managed Care, Other (non HMO) | Admitting: Allergy and Immunology

## 2018-02-24 VITALS — BP 132/90 | HR 78 | Resp 16 | Ht 70.0 in | Wt 208.8 lb

## 2018-02-24 DIAGNOSIS — J339 Nasal polyp, unspecified: Secondary | ICD-10-CM

## 2018-02-24 DIAGNOSIS — J3089 Other allergic rhinitis: Secondary | ICD-10-CM | POA: Diagnosis not present

## 2018-02-24 DIAGNOSIS — J4550 Severe persistent asthma, uncomplicated: Secondary | ICD-10-CM

## 2018-02-24 NOTE — Patient Instructions (Addendum)
    1. Continue to perform Allergen avoidance measures   2. Continue to Treat and prevent inflammation:   A. Nasonex one spray each nostril Daily  B. Breo 200 one inhalation Daily  C. montelukast 10 mg one tablet Daily  3. If needed:   A. Proventil HFA 2 puffs every 4-6 hours  B. loratadine 10 mg one tablet one time per day  C. nasal saline wash  4. Restart nucala (and EpiPen) .   5. Return to clinic in 12 weeks or earlier if problem. Taper?

## 2018-02-24 NOTE — Progress Notes (Signed)
Follow-up Note  Referring Provider: Hadley Pen, MD Primary Provider: Hadley Pen, MD Date of Office Visit: 02/24/2018  Subjective:   Nicolas Willis (DOB: May 11, 1965) is a 52 y.o. male who [facility]2018 in re-evaluation of the following:  HPI: Jessey returns to this clinic in reevaluation of his asthma and allergic rhinitis and nasal polyposis.  His last visit to this clinic was 30 June 2016.  While utilizing a large collection of medical therapy directed against inflammation including the use of mepolizumab he did very well for a prolonged period in time.  However, because of some logistical issues he discontinued all of his medications including his mepolizumab and he has redeveloped problems with wheezing and coughing and nasal congestion and nose blowing and stuffiness in his head requiring him to receive a systemic steroid last week by his primary care doctor along with a course of antibiotics for what appeared to be an episode of sinusitis.  He did receive the flu vaccine this year.  Allergies as of 02/24/2018   No Known Allergies     Medication List      albuterol 108 (90 Base) MCG/ACT inhaler Commonly known as:  PROVENTIL HFA;VENTOLIN HFA Inhale two puffs every four to six hours as needed for cough or wheeze.   cefdinir 300 MG capsule Commonly known as:  OMNICEF Take 1 capsule (300 mg total) by mouth 2 times daily for 10 days.   EPINEPHrine 0.3 mg/0.3 mL Soaj injection Commonly known as:  EPI-PEN Use as directed for life threatening allergic reactions   fexofenadine 180 MG tablet Commonly known as:  ALLEGRA Take by mouth.   fluticasone 50 MCG/ACT nasal spray Commonly known as:  FLONASE Place into the nose.   lisinopril 10 MG tablet Commonly known as:  PRINIVIL,ZESTRIL Take by mouth.   montelukast 10 MG tablet Commonly known as:  SINGULAIR Take 1 tablet (10 mg total) by mouth daily.       Past  Medical History:  Diagnosis Date  . Frequent sinus infections     Past Surgical History:  Procedure Laterality Date  . SINOSCOPY      Review of systems negative except as noted in HPI / PMHx or noted below:  Review of Systems  Constitutional: Negative.   HENT: Negative.   Eyes: Negative.   Respiratory: Negative.   Cardiovascular: Negative.   Gastrointestinal: Negative.   Genitourinary: Negative.   Musculoskeletal: Negative.   Skin: Negative.   Neurological: Negative.   Endo/Heme/Allergies: Negative.   Psychiatric/Behavioral: Negative.      Objective:   Vitals:   02/24/18 1520  BP: 132/90  Pulse: 78  Resp: 16   Height: 5\' 10"  (177.8 cm)  Weight: 208 lb 12.8 oz (94.7 kg)   Physical Exam  HENT:  Head: Normocephalic.  Right Ear: Tympanic membrane, external ear and ear canal normal.  Left Ear: Tympanic membrane, external ear and ear canal normal.  Nose: Nose normal. No mucosal edema or rhinorrhea.  Mouth/Throat: Uvula is midline, oropharynx is clear and moist and mucous membranes are normal. No oropharyngeal exudate.  Eyes: Conjunctivae are normal.  Neck: Trachea normal. No tracheal tenderness present. No tracheal deviation present. No thyromegaly present.  Cardiovascular: Normal rate, regular rhythm, S1 normal, S2 normal and normal heart sounds.  No murmur heard. Pulmonary/Chest: Breath sounds normal. No stridor. No respiratory distress. He has no wheezes. He has no rales.  Musculoskeletal: He exhibits no edema.  Lymphadenopathy:  Head (right side): No tonsillar adenopathy present.       Head (left side): No tonsillar adenopathy present.    He has no cervical adenopathy.  Neurological: He is alert.  Skin: No rash noted. He is not diaphoretic. No erythema. Nails show no clubbing.    Diagnostics:    Spirometry was performed and demonstrated an FEV1 of 3.93 at 100 % of predicted.  The patient had an Asthma Control Test with the following results: ACT  Total Score: 21.    Assessment and Plan:   1. Not well controlled severe persistent asthma   2. Other allergic rhinitis   3. Nasal polyposis      1. Continue to perform Allergen avoidance measures   2. Continue to Treat and prevent inflammation:   A. Nasonex one spray each nostril Daily  B. Breo 200 one inhalation Daily  C. montelukast 10 mg one tablet Daily  3. If needed:   A. Proventil HFA 2 puffs every 4-6 hours  B. loratadine 10 mg one tablet one time per day  C. nasal saline wash  4. Restart nucala (and EpiPen) .   5. Return to clinic in 12 weeks or earlier if problem. Taper?  Esiah will restart anti-inflammatory therapy for his upper and lower airway and given his previous history of nasal polyposis and significant asthma activity we will restart him on mepolizumab.  He will return to this clinic in 12 weeks or earlier if there is a problem.  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  Laurette SchimkeEric Kozlow, MD Allergy / Immunology [facility]

## 2018-02-25 ENCOUNTER — Other Ambulatory Visit: Payer: Self-pay | Admitting: *Deleted

## 2018-02-25 ENCOUNTER — Encounter: Payer: Self-pay | Admitting: Allergy and Immunology

## 2018-02-25 MED ORDER — LORATADINE 10 MG PO TABS: ORAL_TABLET | ORAL | 5 refills | Status: DC

## 2018-02-25 MED ORDER — FLUTICASONE FUROATE-VILANTEROL 200-25 MCG/INH IN AEPB: 1.0000 | INHALATION_SPRAY | Freq: Every day | RESPIRATORY_TRACT | 5 refills | Status: DC

## 2018-03-03 ENCOUNTER — Telehealth: Payer: Self-pay | Admitting: *Deleted

## 2018-03-03 DIAGNOSIS — J4550 Severe persistent asthma, uncomplicated: Secondary | ICD-10-CM

## 2018-03-03 NOTE — Telephone Encounter (Signed)
Advised patients wife and she stated he would go on Friday to get same drawn

## 2018-03-03 NOTE — Telephone Encounter (Signed)
L/m for patient to contact me.  Per [employer] requiring documentation on EOS asthma and last CBC with diff was done 2 years ago. Per Dr Lucie LeatherKozlow okay put order in for patient to go to [facility] to get same drawn as soon as he can.

## 2018-03-06 LAB — CBC WITH DIFFERENTIAL/PLATELET
Basophils Absolute: 0.1 10*3/uL (ref 0.0–0.2)
Basos: 1 %
EOS (ABSOLUTE): 0.2 10*3/uL (ref 0.0–0.4)
Eos: 2 %
Hematocrit: 46.9 % (ref 37.5–51.0)
Hemoglobin: 16.5 g/dL (ref 13.0–17.7)
Immature Grans (Abs): 0 10*3/uL (ref 0.0–0.1)
Immature Granulocytes: 0 %
Lymphocytes Absolute: 1.5 10*3/uL (ref 0.7–3.1)
Lymphs: 15 %
MCH: 31.1 pg (ref 26.6–33.0)
MCHC: 35.2 g/dL (ref 31.5–35.7)
MCV: 89 fL (ref 79–97)
Monocytes Absolute: 0.7 10*3/uL (ref 0.1–0.9)
Monocytes: 8 %
Neutrophils Absolute: 7.2 10*3/uL — ABNORMAL HIGH (ref 1.4–7.0)
Neutrophils: 74 %
Platelets: 323 10*3/uL (ref 150–450)
RBC: 5.3 x10E6/uL (ref 4.14–5.80)
RDW: 11.4 % — ABNORMAL LOW (ref 12.3–15.4)
WBC: 9.7 10*3/uL (ref 3.4–10.8)

## 2018-04-22 ENCOUNTER — Ambulatory Visit (INDEPENDENT_AMBULATORY_CARE_PROVIDER_SITE_OTHER): Payer: Managed Care, Other (non HMO) | Admitting: *Deleted

## 2018-04-22 DIAGNOSIS — J4550 Severe persistent asthma, uncomplicated: Secondary | ICD-10-CM | POA: Diagnosis not present

## 2018-05-20 ENCOUNTER — Ambulatory Visit (INDEPENDENT_AMBULATORY_CARE_PROVIDER_SITE_OTHER): Payer: Managed Care, Other (non HMO) | Admitting: *Deleted

## 2018-05-20 ENCOUNTER — Telehealth: Payer: Self-pay | Admitting: Allergy and Immunology

## 2018-05-20 DIAGNOSIS — J4550 Severe persistent asthma, uncomplicated: Secondary | ICD-10-CM | POA: Diagnosis not present

## 2018-05-20 NOTE — Telephone Encounter (Signed)
Calling about copay programs for the patient It was denied  Needs medical documentation??  Please call  Patient is supposed to get injection today as well- doesn't want to have to go through this with each injection

## 2018-05-20 NOTE — Telephone Encounter (Signed)
Please see message below

## 2018-05-26 NOTE — Telephone Encounter (Signed)
Called wife and she advised that Nucala copay program needs verification of injection to reimburse copay

## 2018-06-02 ENCOUNTER — Telehealth: Payer: Self-pay | Admitting: *Deleted

## 2018-06-02 NOTE — Telephone Encounter (Signed)
Patient wife had reached out to me regarding patient copay for administration.  I called [facility]uc[facility] my way to see if there was something I could fill out to verfiy administration and was advised that they have to call [employer] for verfication so I asked her to do same and she advised she would right now. I will let Nicolas Willis know same

## 2018-06-07 ENCOUNTER — Telehealth: Payer: Self-pay | Admitting: Allergy and Immunology

## 2018-06-07 NOTE — Telephone Encounter (Signed)
Victorino Dike Debono called in and stated that her husband is due for [facility] and is wondering if he should continue taking it.  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  Please advise.

## 2018-06-07 NOTE — Telephone Encounter (Signed)
Please inform patient's wife that this injection is not immunosuppressive and should not increase his susceptibility to viral infections.  This is been shown in multiple studies that were required to get approval for this medicine.  This is not like the biological agent to see on TV for rheumatoid arthritis or psoriasis.  This is an entirely different biological agent that should not interfere with his immune system's ability to fight infection.

## 2018-06-07 NOTE — Telephone Encounter (Signed)
Called and assured patient wife that biologic for his asthma is targeted therapy and not immunosuppressive and he should continue therapy

## 2018-06-17 ENCOUNTER — Ambulatory Visit (INDEPENDENT_AMBULATORY_CARE_PROVIDER_SITE_OTHER): Payer: Managed Care, Other (non HMO) | Admitting: *Deleted

## 2018-06-17 ENCOUNTER — Other Ambulatory Visit: Payer: Self-pay

## 2018-06-17 DIAGNOSIS — J4550 Severe persistent asthma, uncomplicated: Secondary | ICD-10-CM

## 2018-07-15 ENCOUNTER — Ambulatory Visit (INDEPENDENT_AMBULATORY_CARE_PROVIDER_SITE_OTHER): Payer: Managed Care, Other (non HMO) | Admitting: *Deleted

## 2018-07-15 ENCOUNTER — Other Ambulatory Visit: Payer: Self-pay

## 2018-07-15 DIAGNOSIS — J4550 Severe persistent asthma, uncomplicated: Secondary | ICD-10-CM | POA: Diagnosis not present

## 2018-08-12 ENCOUNTER — Other Ambulatory Visit: Payer: Self-pay

## 2018-08-12 ENCOUNTER — Ambulatory Visit (INDEPENDENT_AMBULATORY_CARE_PROVIDER_SITE_OTHER): Payer: Managed Care, Other (non HMO) | Admitting: *Deleted

## 2018-08-12 DIAGNOSIS — J4550 Severe persistent asthma, uncomplicated: Secondary | ICD-10-CM

## 2018-08-16 ENCOUNTER — Encounter: Payer: Self-pay | Admitting: Allergy and Immunology

## 2018-08-16 ENCOUNTER — Other Ambulatory Visit: Payer: Self-pay

## 2018-08-16 ENCOUNTER — Ambulatory Visit (INDEPENDENT_AMBULATORY_CARE_PROVIDER_SITE_OTHER): Payer: Managed Care, Other (non HMO) | Admitting: Allergy and Immunology

## 2018-08-16 VITALS — BP 140/96 | HR 76 | Temp 97.6°F | Resp 16 | Ht 70.0 in | Wt 215.8 lb

## 2018-08-16 DIAGNOSIS — J4550 Severe persistent asthma, uncomplicated: Secondary | ICD-10-CM

## 2018-08-16 DIAGNOSIS — J3089 Other allergic rhinitis: Secondary | ICD-10-CM

## 2018-08-16 DIAGNOSIS — J339 Nasal polyp, unspecified: Secondary | ICD-10-CM

## 2018-08-16 NOTE — Patient Instructions (Addendum)
    1. Continue to perform Allergen avoidance measures   2. Continue to Treat and prevent inflammation:   A. Nasonex one spray each nostril Daily  B. montelukast 10 mg one tablet Daily  C. Mepolizumab injections  3.  Attempt to discontinue Breo 200  4. If needed:   A. Proventil HFA 2 puffs every 4-6 hours  B. loratadine 10 mg one tablet one time per day  C. nasal saline wash  5. Return to clinic in 6 months

## 2018-08-16 NOTE — Progress Notes (Signed)
[place]   Follow-up Note  Referring Provider: Hadley Penobbins, Robert A, MD Primary Provider: Hadley Penobbins, Robert A, MD Date of Office Visit: 08/16/2018  Subjective:   Nicolas Willis (DOB: 06/02/1965) is a 53 y.o. male who [facility] 08[facility]16/2018 in re-evaluation of the following:  HPI: Nicolas Willis returns to this clinic in reevaluation of his severe persistent asthma and allergic rhinitis and nasal polyposis.  His last visit to this clinic was 24 February 2018.  Nicolas Willis has had an excellent interval of time while he continues on mepolizumab injections.  He has not required a systemic steroid or antibiotic for any type of respiratory tract issue.  He can exercise without any difficulty and does not use a short acting bronchodilator.  He can smell and taste and has very little issues with rhinitis other than occasional nasal congestion and some intermittent sneezing.  He does continue to utilize a combination of a long-acting bronchodilator and inhaled steroid, nasal steroid, and leukotriene modifier.  Allergies as of 08/16/2018   No Known Allergies     Medication List    fexofenadine 180 MG tablet Commonly known as:  ALLEGRA Stopped by:  Nicolas Lusty Claudia PollockJ Elvi Leventhal, MD   albuterol 108 (90 Base) MCG/ACT inhaler Commonly known as:  Proventil HFA Inhale two puffs every four to six hours as needed for cough or wheeze.   amLODipine 5 MG tablet Commonly known as:  NORVASC Take 1 tablet (5 mg total) by mouth daily.   EPINEPHrine 0.3 mg/0.3 mL Soaj injection Commonly known as:  EpiPen 2-Pak Use as directed for life threatening allergic reactions   fluticasone furoate-vilanterol 200-25 MCG/INH Aepb Commonly known as:  Breo Ellipta Inhale 1 puff into the lungs daily.   loratadine 10 MG tablet Commonly known as:  CLARITIN TAKE ONE TABLET BY MOUTH EVERY DAY    montelukast 10 MG tablet Commonly known as:  SINGULAIR Take 1  tablet (10 mg total) by mouth daily.   Nucala 100 MG Solr Generic drug:  Mepolizumab       Past Medical History:  Diagnosis Date  . Asthma   . Frequent sinus infections     Past Surgical History:  Procedure Laterality Date  . SINOSCOPY      Review of systems negative except as noted in HPI / PMHx or noted below:  Review of Systems  Constitutional: Negative.   HENT: Negative.   Eyes: Negative.   Respiratory: Negative.   Cardiovascular: Negative.   Gastrointestinal: Negative.   Genitourinary: Negative.   Musculoskeletal: Negative.   Skin: Negative.   Neurological: Negative.   Endo/Heme/Allergies: Negative.   Psychiatric/Behavioral: Negative.      Objective:   Vitals:   08/16/18 0842  BP: (!) 140/96  Pulse: 76  Resp: 16  Temp: 97.6 F (36.4 C)  SpO2: 96%   Height: 5\' 10"  (177.8 cm)  Weight: 215 lb 12.8 oz (97.9 kg)   Physical Exam Constitutional:      Appearance: He is not diaphoretic.  HENT:     Head: Normocephalic.     Right Ear: Tympanic membrane, ear canal and external ear normal.     Left Ear: Tympanic membrane, ear canal and external ear normal.     Nose: Nose normal. No mucosal edema or rhinorrhea.     Mouth/Throat:     Pharynx: Uvula midline. No oropharyngeal exudate.  Eyes:     Conjunctiva/sclera: Conjunctivae normal.  Neck:  Thyroid: No thyromegaly.     Trachea: Trachea normal. No tracheal tenderness or tracheal deviation.  Cardiovascular:     Rate and Rhythm: Normal rate and regular rhythm.     Heart sounds: Normal heart sounds, S1 normal and S2 normal. No murmur.  Pulmonary:     Effort: No respiratory distress.     Breath sounds: Normal breath sounds. No stridor. No wheezing or rales.  Lymphadenopathy:     Head:     Right side of head: No tonsillar adenopathy.     Left side of head: No tonsillar adenopathy.     Cervical: No cervical adenopathy.  Skin:    Findings: No erythema or rash.     Nails: There is no clubbing.    Neurological:     Mental Status: He is alert.     Diagnostics:    Spirometry was performed and demonstrated an FEV1 of 3.72 at 94 % of predicted.   Assessment and Plan:   1. Asthma, severe persistent, well-controlled   2. Other allergic rhinitis   3. Nasal polyposis      1. Continue to perform Allergen avoidance measures   2. Continue to Treat and prevent inflammation:   A. Nasonex one spray each nostril Daily  B. montelukast 10 mg one tablet Daily  C. Mepolizumab injections  3.  Attempt to discontinue Breo 200  4. If needed:   A. Proventil HFA 2 puffs every 4-6 hours  B. loratadine 10 mg one tablet one time per day  C. nasal saline wash  5. Return to clinic in 6 months or earlier if problem  Nicolas Willis appears to be doing very well and we will now see if there is an opportunity to further consolidate his medical treatment by discontinuing his Breo while he continues on mepolizumab and other anti-inflammatory agents for his airway as noted above.  Assuming he does well I will see him back in this clinic in 6 months or earlier if there is an issue.  Nicolas Schimke, MD Allergy / Immunology [facility]

## 2018-08-17 ENCOUNTER — Encounter: Payer: Self-pay | Admitting: Allergy and Immunology

## 2018-09-09 ENCOUNTER — Ambulatory Visit: Payer: Self-pay

## 2018-09-13 ENCOUNTER — Ambulatory Visit (INDEPENDENT_AMBULATORY_CARE_PROVIDER_SITE_OTHER): Payer: Managed Care, Other (non HMO) | Admitting: *Deleted

## 2018-09-13 ENCOUNTER — Other Ambulatory Visit: Payer: Self-pay

## 2018-09-13 DIAGNOSIS — J4550 Severe persistent asthma, uncomplicated: Secondary | ICD-10-CM

## 2018-10-11 ENCOUNTER — Ambulatory Visit (INDEPENDENT_AMBULATORY_CARE_PROVIDER_SITE_OTHER): Payer: Managed Care, Other (non HMO) | Admitting: *Deleted

## 2018-10-11 ENCOUNTER — Other Ambulatory Visit: Payer: Self-pay

## 2018-10-11 DIAGNOSIS — J4550 Severe persistent asthma, uncomplicated: Secondary | ICD-10-CM

## 2018-11-08 ENCOUNTER — Ambulatory Visit: Payer: Managed Care, Other (non HMO)

## 2018-11-10 ENCOUNTER — Ambulatory Visit (INDEPENDENT_AMBULATORY_CARE_PROVIDER_SITE_OTHER): Payer: Managed Care, Other (non HMO) | Admitting: *Deleted

## 2018-11-10 DIAGNOSIS — J4550 Severe persistent asthma, uncomplicated: Secondary | ICD-10-CM | POA: Diagnosis not present

## 2018-12-07 ENCOUNTER — Telehealth: Payer: Self-pay | Admitting: Allergy and Immunology

## 2018-12-07 NOTE — Telephone Encounter (Signed)
Nicolas Willis called because his Biologic was supposed to be delivered on 9/17 for his injection date of 9/23.  When he called [facility] they told him they couldn't deliver it because they needed [place] to call them and confirm we wanted them to deliver it.  Please advise.

## 2018-12-07 NOTE — Telephone Encounter (Signed)
Shannon- can you follow up on this please?

## 2018-12-08 ENCOUNTER — Ambulatory Visit: Payer: Self-pay

## 2018-12-08 NOTE — Telephone Encounter (Signed)
Spoke with Nicolas Willis will be delivered on Friday 09/25. I will inform Lacey.

## 2018-12-13 ENCOUNTER — Ambulatory Visit (INDEPENDENT_AMBULATORY_CARE_PROVIDER_SITE_OTHER): Payer: Managed Care, Other (non HMO) | Admitting: *Deleted

## 2018-12-13 DIAGNOSIS — J4550 Severe persistent asthma, uncomplicated: Secondary | ICD-10-CM

## 2018-12-20 NOTE — Telephone Encounter (Signed)
Patient received Nucala injection 12/13/2018.

## 2019-01-10 ENCOUNTER — Ambulatory Visit: Payer: Self-pay

## 2019-01-13 ENCOUNTER — Ambulatory Visit (INDEPENDENT_AMBULATORY_CARE_PROVIDER_SITE_OTHER): Payer: Managed Care, Other (non HMO) | Admitting: *Deleted

## 2019-01-13 DIAGNOSIS — J4550 Severe persistent asthma, uncomplicated: Secondary | ICD-10-CM

## 2019-02-09 ENCOUNTER — Ambulatory Visit (INDEPENDENT_AMBULATORY_CARE_PROVIDER_SITE_OTHER): Payer: Managed Care, Other (non HMO)

## 2019-02-09 ENCOUNTER — Ambulatory Visit: Payer: Self-pay

## 2019-02-09 DIAGNOSIS — J4550 Severe persistent asthma, uncomplicated: Secondary | ICD-10-CM | POA: Diagnosis not present

## 2019-02-21 ENCOUNTER — Other Ambulatory Visit: Payer: Self-pay | Admitting: *Deleted

## 2019-02-21 MED ORDER — MONTELUKAST SODIUM 10 MG PO TABS: ORAL_TABLET | ORAL | 0 refills | Status: DC

## 2019-03-09 ENCOUNTER — Ambulatory Visit (INDEPENDENT_AMBULATORY_CARE_PROVIDER_SITE_OTHER): Payer: Managed Care, Other (non HMO) | Admitting: *Deleted

## 2019-03-09 ENCOUNTER — Other Ambulatory Visit: Payer: Self-pay

## 2019-03-09 DIAGNOSIS — J4550 Severe persistent asthma, uncomplicated: Secondary | ICD-10-CM | POA: Diagnosis not present

## 2019-03-14 ENCOUNTER — Other Ambulatory Visit: Payer: Self-pay | Admitting: Allergy and Immunology

## 2019-03-14 NOTE — Telephone Encounter (Signed)
Please advise 

## 2019-03-24 ENCOUNTER — Encounter: Payer: Self-pay | Admitting: Allergy and Immunology

## 2019-03-24 ENCOUNTER — Ambulatory Visit (INDEPENDENT_AMBULATORY_CARE_PROVIDER_SITE_OTHER): Payer: Managed Care, Other (non HMO) | Admitting: Allergy and Immunology

## 2019-03-24 ENCOUNTER — Other Ambulatory Visit: Payer: Self-pay

## 2019-03-24 VITALS — BP 150/92 | HR 85 | Temp 97.8°F | Resp 16

## 2019-03-24 DIAGNOSIS — J3089 Other allergic rhinitis: Secondary | ICD-10-CM

## 2019-03-24 DIAGNOSIS — J4550 Severe persistent asthma, uncomplicated: Secondary | ICD-10-CM

## 2019-03-24 DIAGNOSIS — J339 Nasal polyp, unspecified: Secondary | ICD-10-CM

## 2019-03-24 MED ORDER — MONTELUKAST SODIUM 10 MG PO TABS: ORAL_TABLET | ORAL | 5 refills | Status: DC

## 2019-03-24 NOTE — Progress Notes (Signed)
[facility] - [place] - [place]   Follow-up Note  Referring Provider: Myrlene Broker, MD Primary Provider: Myrlene Broker, MD Date of Office Visit: 03/24/2019  Subjective:   Nicolas Willis (DOB: 02-12-1966) is a 53 y.o. 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  His last visit to this clinic was 16 August 2018.  He had a isolated event of fever and stuffy nose and anosmia and fatigue for 2 days that resolved without any sequela at the early part of November and apparently had a Covid antibody test in December which was negative.  Otherwise, he has had absolutely no respiratory tract symptoms involving his head or chest.  He can exercise and does not use a short acting bronchodilator and he has no nasal congestion and can smell and taste without any problem.  He continues to use mepolizumab injections and montelukast and a nasal steroid but no longer requires a controller agent for his asthma.  He did receive the flu vaccine.  He has not received the Covid vaccine and will not be receiving the Covid vaccine.  Allergies as of 03/24/2019   No Known Allergies     Medication List    albuterol 108 (90 Base) MCG/ACT inhaler Commonly known as: Proventil HFA Inhale two puffs every four to six hours as needed for cough or wheeze.   amLODipine 5 MG tablet Commonly known as: NORVASC Take 1 tablet (5 mg total) by mouth daily.   EPINEPHrine 0.3 mg/0.3 mL Soaj injection Commonly known as: EpiPen 2-Pak Use as directed for life threatening allergic reactions   loratadine 10 MG tablet Commonly known as: CLARITIN TAKE ONE TABLET BY MOUTH EVERY DAY   montelukast 10 MG tablet Commonly known as: SINGULAIR Take one tablet once daily as  directed   Nasonex 50 MCG/ACT nasal spray Generic drug: mometasone Place 2 sprays into the nose daily.   Nucala 100 MG Solr Generic drug: Mepolizumab INJECT 100MG  SUBCUTANEOUSLY EVERY 4 WEEKS.       Past Medical History:  Diagnosis Date  . Asthma   . Frequent sinus infections     Past Surgical History:  Procedure Laterality Date  . SINOSCOPY      Review of systems negative except as noted in HPI / PMHx or noted below:  Review of Systems  Constitutional: Negative.   HENT: Negative.   Eyes: Negative.   Respiratory: Negative.   Cardiovascular: Negative.   Gastrointestinal: Negative.   Genitourinary: Negative.   Musculoskeletal: Negative.   Skin: Negative.   Neurological: Negative.   Endo/Heme/Allergies: Negative.   Psychiatric/Behavioral: Negative.      Objective:   Vitals:   03/24/19 1005  BP: (!) 150/92  Pulse: 85  Resp: 16  Temp: 97.8 F (36.6 C)  SpO2: 96%          Physical Exam Constitutional:      Appearance: He is not diaphoretic.  HENT:     Head: Normocephalic.     Right Ear: Tympanic membrane, ear canal and external ear normal.     Left Ear: Tympanic membrane, ear canal and external ear normal.     Nose: Nose normal. No mucosal edema or rhinorrhea.     Mouth/Throat:     Pharynx: Uvula midline. No oropharyngeal exudate.  Eyes:  Conjunctiva/sclera: Conjunctivae normal.  Neck:     Thyroid: No thyromegaly.     Trachea: Trachea normal. No tracheal tenderness or tracheal deviation.  Cardiovascular:     Rate and Rhythm: Normal rate and regular rhythm.     Heart sounds: Normal heart sounds, S1 normal and S2 normal. No murmur.  Pulmonary:     Effort: No respiratory distress.     Breath sounds: Normal breath sounds. No stridor. No wheezing or rales.  Lymphadenopathy:     Head:     Right side of head: No tonsillar adenopathy.     Left side of head: No tonsillar adenopathy.     Cervical: No cervical adenopathy.  Skin:    Findings: No  erythema or rash.     Nails: There is no clubbing.  Neurological:     Mental Status: He is alert.     Diagnostics:    Spirometry was performed and demonstrated an FEV1 of 4.39 at 112 % of predicted.  Assessment and Plan:   1. Asthma, severe persistent, well-controlled   2. Other allergic rhinitis   3. Nasal polyposis      1. Continue to perform Allergen avoidance measures as best as possible  2. Continue to Treat and prevent inflammation:   A. Nasonex one spray each nostril Daily  B. montelukast 10 mg one tablet Daily  C. Mepolizumab injections  3. If needed:   A. Proventil HFA 2 puffs every 4-6 hours  B. loratadine 10 mg one tablet one time per day  C. nasal saline wash  4.  Obtain Covid vaccine  5. Return to clinic in 12 months  Nicolas Willis has really done very well on his current medical plan which includes mepolizumab injections and anti-inflammatory agents for his upper airway.  We will continue to have him utilize this plan and I will see him back in his clinic in 1 year or earlier if there is a problem.  I did encourage him to obtain the Covid vaccine especially given the fact that he is in the [employer] and interacts with humans on a daily basis.  I directed him to the [facility] website concerning this vaccine.  Laurette Schimke, MD Allergy / Immunology [facility]

## 2019-03-24 NOTE — Patient Instructions (Signed)
    1. Continue to perform Allergen avoidance measures as best as possible  2. Continue to Treat and prevent inflammation:   A. Nasonex one spray each nostril Daily  B. montelukast 10 mg one tablet Daily  C. Mepolizumab injections  3. If needed:   A. Proventil HFA 2 puffs every 4-6 hours  B. loratadine 10 mg one tablet one time per day  C. nasal saline wash  4.  Obtain Covid vaccine  5. Return to clinic in 12 months

## 2019-03-30 ENCOUNTER — Other Ambulatory Visit: Payer: Self-pay | Admitting: *Deleted

## 2019-03-30 MED ORDER — MOMETASONE FUROATE 50 MCG/ACT NA SUSP: NASAL | 11 refills | Status: AC

## 2019-04-04 ENCOUNTER — Encounter: Payer: Self-pay | Admitting: Allergy and Immunology

## 2019-04-06 ENCOUNTER — Other Ambulatory Visit: Payer: Self-pay

## 2019-04-06 ENCOUNTER — Ambulatory Visit (INDEPENDENT_AMBULATORY_CARE_PROVIDER_SITE_OTHER): Payer: Managed Care, Other (non HMO) | Admitting: *Deleted

## 2019-04-06 DIAGNOSIS — J4550 Severe persistent asthma, uncomplicated: Secondary | ICD-10-CM | POA: Diagnosis not present

## 2019-05-04 ENCOUNTER — Other Ambulatory Visit: Payer: Self-pay

## 2019-05-04 ENCOUNTER — Ambulatory Visit (INDEPENDENT_AMBULATORY_CARE_PROVIDER_SITE_OTHER): Payer: Managed Care, Other (non HMO) | Admitting: *Deleted

## 2019-05-04 DIAGNOSIS — J4550 Severe persistent asthma, uncomplicated: Secondary | ICD-10-CM | POA: Diagnosis not present

## 2019-05-16 ENCOUNTER — Telehealth: Payer: Self-pay | Admitting: Allergy and Immunology

## 2019-05-16 ENCOUNTER — Other Ambulatory Visit: Payer: Self-pay | Admitting: *Deleted

## 2019-05-16 MED ORDER — ALBUTEROL SULFATE HFA 108 (90 BASE) MCG/ACT IN AERS: INHALATION_SPRAY | RESPIRATORY_TRACT | 1 refills | Status: DC

## 2019-05-16 NOTE — Telephone Encounter (Signed)
Proventil to [facility].  Due for yearly in 2022.

## 2019-05-16 NOTE — Telephone Encounter (Signed)
RF sent.

## 2019-05-18 ENCOUNTER — Encounter: Payer: Self-pay | Admitting: Allergy and Immunology

## 2019-05-18 ENCOUNTER — Other Ambulatory Visit: Payer: Self-pay

## 2019-05-18 ENCOUNTER — Ambulatory Visit (INDEPENDENT_AMBULATORY_CARE_PROVIDER_SITE_OTHER): Payer: Managed Care, Other (non HMO) | Admitting: Allergy and Immunology

## 2019-05-18 VITALS — BP 120/72 | HR 84 | Temp 98.6°F | Resp 16

## 2019-05-18 DIAGNOSIS — J4550 Severe persistent asthma, uncomplicated: Secondary | ICD-10-CM

## 2019-05-18 DIAGNOSIS — J3089 Other allergic rhinitis: Secondary | ICD-10-CM

## 2019-05-18 DIAGNOSIS — J339 Nasal polyp, unspecified: Secondary | ICD-10-CM | POA: Diagnosis not present

## 2019-05-18 MED ORDER — METHYLPREDNISOLONE ACETATE 80 MG/ML IJ SUSP
80.0000 mg | Freq: Once | INTRAMUSCULAR | Status: AC
  Administered 2019-05-18: 80 mg via INTRAMUSCULAR

## 2019-05-18 NOTE — Progress Notes (Signed)
[place] - [place] - [place]   Follow-up Note  Referring Provider: Hadley Pen, MD Primary Provider: Hadley Pen, MD Date of Office Visit: 05/18/2019  Subjective:   Nicolas Willis (DOB: 1965-09-29) is a 54 y.o. male who [facility] on 05[facility]18/2019 in re-evaluation of the following:  HPI: Jadrian returns to this clinic in reevaluation of eosinophilic driven airway disease including severe persistent asthma and allergic rhinitis and nasal polyposis.  His last visit to this clinic was 24 March 2019.  He was really doing well with his airway while consistently using anti-inflammatory medications including the use of mepolizumab injections until approximately 72 hours ago at which point in time he developed rather acute shortness of breath and wheezing and coughing without any nasal symptoms, anosmia, fever, or constitutional symptoms.  He has not had any contact with individuals known to be sick.  He has received the flu vaccine but has not received the Covid vaccine to date.  Allergies as of 05/18/2019   No Known Allergies     Medication List      albuterol 108 (90 Base) MCG/ACT inhaler Commonly known as: Proventil HFA Inhale two puffs every four to six hours as needed for cough or wheeze.   amLODipine 5 MG tablet Commonly known as: NORVASC Take 1 tablet (5 mg total) by mouth daily.   EPINEPHrine 0.3 mg/0.3 mL Soaj injection Commonly known as: EpiPen 2-Pak Use as directed for life threatening allergic reactions   loratadine 10 MG tablet Commonly known as: CLARITIN TAKE ONE TABLET BY MOUTH EVERY DAY   mometasone 50 MCG/ACT nasal spray Commonly known as: Nasonex Use one spray in each nostril once or twice daily   montelukast 10 MG tablet Commonly known as: SINGULAIR Take one tablet once daily as directed   Nucala 100 MG Solr Generic drug: Mepolizumab INJECT 100MG  SUBCUTANEOUSLY EVERY 4 WEEKS.     pantoprazole 40 MG tablet Commonly known as: PROTONIX Take 40 mg by mouth daily.       Past Medical History:  Diagnosis Date  . Asthma   . Frequent sinus infections     Past Surgical History:  Procedure Laterality Date  . SINOSCOPY      Review of systems negative except as noted in HPI / PMHx or noted below:  Review of Systems  Constitutional: Negative.   HENT: Negative.   Eyes: Negative.   Respiratory: Negative.   Cardiovascular: Negative.   Gastrointestinal: Negative.   Genitourinary: Negative.   Musculoskeletal: Negative.   Skin: Negative.   Neurological: Negative.   Endo/Heme/Allergies: Negative.   Psychiatric/Behavioral: Negative.      Objective:   Vitals:   05/18/19 1125  BP: 120/72  Pulse: 84  Resp: 16  Temp: 98.6 F (37 C)  SpO2: 95%          Physical Exam Constitutional:      Appearance: He is not diaphoretic.  HENT:     Head: Normocephalic.     Right Ear: Tympanic membrane, ear canal and external ear normal.     Left Ear: Tympanic membrane, ear canal and external ear normal.     Nose: Nose normal. No mucosal edema or rhinorrhea.     Mouth/Throat:     Pharynx: Uvula midline. No oropharyngeal exudate.  Eyes:     Conjunctiva/sclera: Conjunctivae normal.  Neck:     Thyroid: No thyromegaly.     Trachea: Trachea normal. No tracheal tenderness or tracheal  deviation.  Cardiovascular:     Rate and Rhythm: Normal rate and regular rhythm.     Heart sounds: Normal heart sounds, S1 normal and S2 normal. No murmur.  Pulmonary:     Effort: No respiratory distress.     Breath sounds: Normal breath sounds. No stridor. No wheezing or rales.  Lymphadenopathy:     Head:     Right side of head: No tonsillar adenopathy.     Left side of head: No tonsillar adenopathy.     Cervical: No cervical adenopathy.  Skin:    Findings: No erythema or rash.     Nails: There is no clubbing.  Neurological:     Mental Status: He is alert.     Diagnostics:     Spirometry was performed and demonstrated an FEV1 of 3.44 at 88 % of predicted.  Assessment and Plan:   1. Not well controlled severe persistent asthma   2. Other allergic rhinitis   3. Nasal polyposis      1. Continue to perform Allergen avoidance measures as best as possible  2. Continue to Treat and prevent inflammation:   A. Nasonex one spray each nostril Daily  B. montelukast 10 mg one tablet Daily  C. Mepolizumab injections  3. If needed:   A. Proventil HFA 2 puffs every 4-6 hours  B. loratadine 10 mg one tablet one time per day  C. nasal saline wash  4.  For this recent episode:   A.  Depo-Medrol 80 IM delivered in clinic  5. Return to clinic in 12 months  6. Obtain Covid vaccine in 2 weeks  Conroy appears to have some inflammation of his airway with unknown trigger.  This very well could have been secondary to pollen exposure which was at relatively high counts this past weekend.  There does not appear to be a history consistent with Covid infection during today's presentation thus we will hold off on a nasopharyngeal swab at this point in time.  I will treat him with a systemic anti-inflammatory agent to address his inflammation and assume he will do well with this plan.  He will continue on a large collection of anti-inflammatory agents for his airway including mepolizumab.  I have encouraged him to obtain the Covid vaccine but wait approximately 2 weeks after his Depo-Medrol injection.  Allena Katz, MD Allergy / Immunology [place]

## 2019-05-18 NOTE — Patient Instructions (Addendum)
    1. Continue to perform Allergen avoidance measures as best as possible  2. Continue to Treat and prevent inflammation:   A. Nasonex one spray each nostril Daily  B. montelukast 10 mg one tablet Daily  C. Mepolizumab injections  3. If needed:   A. Proventil HFA 2 puffs every 4-6 hours  B. loratadine 10 mg one tablet one time per day  C. nasal saline wash  4.  For this recent episode:   A.  Depo-Medrol 80 IM delivered in clinic  5. Return to clinic in 12 months  6. Obtain Covid vaccine in 2 weeks

## 2019-05-19 ENCOUNTER — Encounter: Payer: Self-pay | Admitting: Allergy and Immunology

## 2019-05-21 ENCOUNTER — Other Ambulatory Visit: Payer: Self-pay

## 2019-05-21 ENCOUNTER — Encounter (HOSPITAL_COMMUNITY): Payer: Self-pay | Admitting: *Deleted

## 2019-05-21 ENCOUNTER — Inpatient Hospital Stay (HOSPITAL_COMMUNITY)
Admission: EM | Admit: 2019-05-21 | Discharge: 2019-05-25 | DRG: 177 | Disposition: A | Discharge status: Home / Self Care | Payer: Managed Care, Other (non HMO) | Attending: Internal Medicine | Admitting: Internal Medicine

## 2019-05-21 ENCOUNTER — Emergency Department (HOSPITAL_COMMUNITY): Payer: Managed Care, Other (non HMO)

## 2019-05-21 DIAGNOSIS — J1282 Pneumonia due to coronavirus disease 2019: Secondary | ICD-10-CM | POA: Diagnosis present

## 2019-05-21 DIAGNOSIS — I160 Hypertensive urgency: Secondary | ICD-10-CM

## 2019-05-21 DIAGNOSIS — K219 Gastro-esophageal reflux disease without esophagitis: Secondary | ICD-10-CM | POA: Diagnosis present

## 2019-05-21 DIAGNOSIS — R0902 Hypoxemia: Secondary | ICD-10-CM | POA: Diagnosis present

## 2019-05-21 DIAGNOSIS — Z806 Family history of leukemia: Secondary | ICD-10-CM | POA: Diagnosis not present

## 2019-05-21 DIAGNOSIS — I10 Essential (primary) hypertension: Secondary | ICD-10-CM | POA: Diagnosis present

## 2019-05-21 DIAGNOSIS — J9601 Acute respiratory failure with hypoxia: Secondary | ICD-10-CM | POA: Diagnosis present

## 2019-05-21 DIAGNOSIS — J45909 Unspecified asthma, uncomplicated: Secondary | ICD-10-CM | POA: Diagnosis present

## 2019-05-21 DIAGNOSIS — Z8249 Family history of ischemic heart disease and other diseases of the circulatory system: Secondary | ICD-10-CM | POA: Diagnosis not present

## 2019-05-21 DIAGNOSIS — J069 Acute upper respiratory infection, unspecified: Secondary | ICD-10-CM | POA: Diagnosis not present

## 2019-05-21 DIAGNOSIS — U071 COVID-19: Principal | ICD-10-CM

## 2019-05-21 LAB — BASIC METABOLIC PANEL
Anion gap: 12 (ref 5–15)
BUN: 17 mg/dL (ref 6–20)
CO2: 23 mmol/L (ref 22–32)
Calcium: 8.7 mg/dL — ABNORMAL LOW (ref 8.9–10.3)
Chloride: 103 mmol/L (ref 98–111)
Creatinine, Ser: 1.09 mg/dL (ref 0.61–1.24)
GFR calc Af Amer: >60 mL/min (ref >60)
GFR calc non Af Amer: >60 mL/min (ref >60)
Glucose, Bld: 109 mg/dL — ABNORMAL HIGH (ref 70–99)
Potassium: 3.9 mmol/L (ref 3.5–5.1)
Sodium: 138 mmol/L (ref 135–145)

## 2019-05-21 LAB — CBC
HCT: 47.3 % (ref 39.0–52.0)
Hemoglobin: 15.6 g/dL (ref 13.0–17.0)
MCH: 30.1 pg (ref 26.0–34.0)
MCHC: 33 g/dL (ref 30.0–36.0)
MCV: 91.1 fL (ref 80.0–100.0)
Platelets: 282 10*3/uL (ref 150–400)
RBC: 5.19 MIL/uL (ref 4.22–5.81)
RDW: 11.6 % (ref 11.5–15.5)
WBC: 7.4 10*3/uL (ref 4.0–10.5)
nRBC: 0 % (ref 0.0–0.2)

## 2019-05-21 LAB — LACTIC ACID, PLASMA: Lactic Acid, Venous: 1.1 mmol/L (ref 0.5–1.9)

## 2019-05-21 LAB — PROCALCITONIN: Procalcitonin: <0.1 ng/mL

## 2019-05-21 LAB — TRIGLYCERIDES: Triglycerides: 333 mg/dL — ABNORMAL HIGH (ref <150)

## 2019-05-21 LAB — FIBRINOGEN: Fibrinogen: 609 mg/dL — ABNORMAL HIGH (ref 210–475)

## 2019-05-21 LAB — LACTATE DEHYDROGENASE: LDH: 300 U/L — ABNORMAL HIGH (ref 98–192)

## 2019-05-21 LAB — TROPONIN I (HIGH SENSITIVITY): Troponin I (High Sensitivity): 7 ng/L (ref <18)

## 2019-05-21 LAB — D-DIMER, QUANTITATIVE: D-Dimer, Quant: 0.72 ug/mL-FEU — ABNORMAL HIGH (ref 0.00–0.50)

## 2019-05-21 LAB — FERRITIN: Ferritin: 563 ng/mL — ABNORMAL HIGH (ref 24–336)

## 2019-05-21 LAB — C-REACTIVE PROTEIN: CRP: 3.3 mg/dL — ABNORMAL HIGH (ref <1.0)

## 2019-05-21 MED ORDER — AMLODIPINE BESYLATE 5 MG PO TABS
5.0000 mg | ORAL_TABLET | Freq: Every day | ORAL | Status: DC
  Administered 2019-05-22 – 2019-05-24 (×3): 5 mg via ORAL
  Filled 2019-05-21 (×3): qty 1

## 2019-05-21 MED ORDER — PANTOPRAZOLE SODIUM 40 MG PO TBEC
40.0000 mg | DELAYED_RELEASE_TABLET | Freq: Every day | ORAL | Status: DC
  Administered 2019-05-22 – 2019-05-24 (×4): 40 mg via ORAL
  Filled 2019-05-21 (×4): qty 1

## 2019-05-21 MED ORDER — PNEUMOCOCCAL VAC POLYVALENT 25 MCG/0.5ML IJ INJ
0.5000 mL | INJECTION | INTRAMUSCULAR | Status: DC
  Filled 2019-05-21: qty 0.5

## 2019-05-21 MED ORDER — SODIUM CHLORIDE 0.9% FLUSH
3.0000 mL | Freq: Once | INTRAVENOUS | Status: AC
  Administered 2019-05-21: 3 mL via INTRAVENOUS

## 2019-05-21 MED ORDER — ALBUTEROL SULFATE HFA 108 (90 BASE) MCG/ACT IN AERS
2.0000 | INHALATION_SPRAY | Freq: Four times a day (QID) | RESPIRATORY_TRACT | Status: DC
  Administered 2019-05-21: 2 via RESPIRATORY_TRACT
  Filled 2019-05-21: qty 6.7

## 2019-05-21 MED ORDER — HYDROCODONE-ACETAMINOPHEN 5-325 MG PO TABS: 1.0000 | ORAL_TABLET | ORAL | Status: DC | PRN

## 2019-05-21 MED ORDER — SODIUM CHLORIDE 0.9% FLUSH: 3.0000 mL | INTRAVENOUS | Status: DC | PRN

## 2019-05-21 MED ORDER — SODIUM CHLORIDE 0.9 % IV SOLN
100.0000 mg | Freq: Every day | INTRAVENOUS | Status: AC
  Administered 2019-05-22 – 2019-05-25 (×4): 100 mg via INTRAVENOUS
  Filled 2019-05-21 (×5): qty 20

## 2019-05-21 MED ORDER — SENNOSIDES-DOCUSATE SODIUM 8.6-50 MG PO TABS: 1.0000 | ORAL_TABLET | Freq: Every evening | ORAL | Status: DC | PRN

## 2019-05-21 MED ORDER — ONDANSETRON HCL 4 MG PO TABS: 4.0000 mg | ORAL_TABLET | Freq: Four times a day (QID) | ORAL | Status: DC | PRN

## 2019-05-21 MED ORDER — ACETAMINOPHEN 325 MG PO TABS
650.0000 mg | ORAL_TABLET | Freq: Four times a day (QID) | ORAL | Status: DC | PRN
  Administered 2019-05-22 – 2019-05-23 (×2): 650 mg via ORAL
  Filled 2019-05-21 (×2): qty 2

## 2019-05-21 MED ORDER — AMLODIPINE BESYLATE 5 MG PO TABS
5.0000 mg | ORAL_TABLET | Freq: Once | ORAL | Status: AC
  Administered 2019-05-21: 5 mg via ORAL
  Filled 2019-05-21: qty 1

## 2019-05-21 MED ORDER — MONTELUKAST SODIUM 10 MG PO TABS
10.0000 mg | ORAL_TABLET | Freq: Every day | ORAL | Status: DC
  Administered 2019-05-21: 10 mg via ORAL
  Filled 2019-05-21: qty 1

## 2019-05-21 MED ORDER — FLUTICASONE PROPIONATE 50 MCG/ACT NA SUSP
1.0000 | Freq: Every day | NASAL | Status: DC
  Filled 2019-05-21: qty 16

## 2019-05-21 MED ORDER — ONDANSETRON HCL 4 MG/2ML IJ SOLN: 4.0000 mg | Freq: Four times a day (QID) | INTRAMUSCULAR | Status: DC | PRN

## 2019-05-21 MED ORDER — SODIUM CHLORIDE 0.9 % IV SOLN: 200.0000 mg | Freq: Once | INTRAVENOUS | Status: DC

## 2019-05-21 MED ORDER — SODIUM CHLORIDE 0.9% FLUSH
3.0000 mL | Freq: Two times a day (BID) | INTRAVENOUS | Status: DC
  Administered 2019-05-21: 3 mL via INTRAVENOUS

## 2019-05-21 MED ORDER — ENOXAPARIN SODIUM 40 MG/0.4ML ~~LOC~~ SOLN
40.0000 mg | Freq: Every day | SUBCUTANEOUS | Status: DC
  Administered 2019-05-21 – 2019-05-24 (×4): 40 mg via SUBCUTANEOUS
  Filled 2019-05-21 (×4): qty 0.4

## 2019-05-21 MED ORDER — LABETALOL HCL 5 MG/ML IV SOLN: 10.0000 mg | INTRAVENOUS | Status: DC | PRN

## 2019-05-21 MED ORDER — LORATADINE 10 MG PO TABS: 10.0000 mg | ORAL_TABLET | Freq: Every day | ORAL | Status: DC | PRN

## 2019-05-21 MED ORDER — DEXAMETHASONE SODIUM PHOSPHATE 10 MG/ML IJ SOLN
6.0000 mg | INTRAMUSCULAR | Status: DC
  Administered 2019-05-22 – 2019-05-24 (×3): 6 mg via INTRAVENOUS
  Filled 2019-05-21 (×3): qty 1

## 2019-05-21 MED ORDER — SALINE SPRAY 0.65 % NA SOLN: 1.0000 | NASAL | Status: DC | PRN

## 2019-05-21 MED ORDER — MONTELUKAST SODIUM 10 MG PO TABS
10.0000 mg | ORAL_TABLET | Freq: Every day | ORAL | Status: DC
  Administered 2019-05-22 – 2019-05-24 (×3): 10 mg via ORAL
  Filled 2019-05-21 (×4): qty 1

## 2019-05-21 MED ORDER — SODIUM CHLORIDE 0.9 % IV SOLN: 100.0000 mg | Freq: Every day | INTRAVENOUS | Status: DC

## 2019-05-21 MED ORDER — SODIUM CHLORIDE 0.9 % IV SOLN
250.0000 mL | INTRAVENOUS | Status: DC | PRN
  Administered 2019-05-22: 250 mL via INTRAVENOUS

## 2019-05-21 MED ORDER — SODIUM CHLORIDE 0.9 % IV SOLN
100.0000 mg | INTRAVENOUS | Status: AC
  Administered 2019-05-22 (×2): 100 mg via INTRAVENOUS
  Filled 2019-05-21 (×2): qty 20

## 2019-05-21 MED ORDER — DEXAMETHASONE SODIUM PHOSPHATE 10 MG/ML IJ SOLN
6.0000 mg | Freq: Once | INTRAMUSCULAR | Status: AC
  Administered 2019-05-21: 6 mg via INTRAVENOUS
  Filled 2019-05-21: qty 1

## 2019-05-21 NOTE — ED Triage Notes (Signed)
Pt arrives via POV with c/o worsening sob tonight. Has had some cough, fevers, SOB, saw his provider, had covid test done, does not know his results (noted to be positive in chart). Pt with obvious exertional sob, sats in triage 85-90%.

## 2019-05-21 NOTE — ED Provider Notes (Addendum)
[facility] EMERGENCY DEPARTMENT Provider Note   CSN: [id number] Arrival date & time: 05/21/19  2042     History Chief Complaint  Patient presents with  . Shortness of Breath    Nicolas Willis is a 54 y.o. male with a past medical history of asthma, positive Covid test on 05/19/2019 by PCP presenting to the ED for shortness of breath.  Patient reports ongoing shortness of breath, cough, chills since 05/13/2019.  Symptoms got worse today.  He has been using his home inhalers with only minimal improvement in her symptoms.  He was given a steroid shot by his PCP when he was seen in the office 2 days ago.  He has not noticed much of a difference with the steroid shot.  He reports central chest pain that is worse with coughing.  Denies any abdominal pain, vomiting, diarrhea.  He works as a Veterinary surgeon so he is unsure if he has had any known exposures to Covid.  HPI     Past Medical History:  Diagnosis Date  . Asthma   . Frequent sinus infections     There are no problems to display for this patient.   Past Surgical History:  Procedure Laterality Date  . SINOSCOPY         Family History  Problem Relation Age of Onset  . Leukemia Mother   . Heart disease Maternal Grandmother   . Heart disease Maternal Grandfather     Social History   Tobacco Use  . Smoking status: Never Smoker  . Smokeless tobacco: Never Used  Substance Use Topics  . Alcohol use: No  . Drug use: No    Home Medications Prior to Admission medications   Medication Sig Start Date End Date Taking? Authorizing Provider  albuterol (PROVENTIL HFA) 108 (90 Base) MCG/ACT inhaler Inhale two puffs every four to six hours as needed for cough or wheeze. 05/16/19   Kozlow, Alvira Philips, MD  amLODipine (NORVASC) 5 MG tablet Take 1 tablet (5 mg total) by mouth daily. 08/12/18   [provider]  EPINEPHrine (EPIPEN 2-PAK) 0.3 mg/0.3 mL IJ SOAJ injection Use as directed for life threatening allergic reactions  11/01/15   Kozlow, Alvira Philips, MD  loratadine (CLARITIN) 10 MG tablet TAKE ONE TABLET BY MOUTH EVERY DAY 02/25/18   [provider]  mometasone (NASONEX) 50 MCG/ACT nasal spray Use one spray in each nostril once or twice daily 03/30/19   Kozlow, Alvira Philips, MD  montelukast (SINGULAIR) 10 MG tablet Take one tablet once daily as directed 03/24/19   Kozlow, Alvira Philips, MD  NUCALA 100 MG SOLR INJECT 100MG  SUBCUTANEOUSLY EVERY 4 WEEKS. 03/14/19   Kozlow, 03/16/19, MD  pantoprazole (PROTONIX) 40 MG tablet Take 40 mg by mouth daily. 04/29/19   [provider]    Allergies    Patient has no known allergies.  Review of Systems   Review of Systems  Constitutional: Positive for chills. Negative for appetite change and fever.  HENT: Negative for ear pain, rhinorrhea, sneezing and sore throat.   Eyes: Negative for photophobia and visual disturbance.  Respiratory: Positive for cough and shortness of breath. Negative for chest tightness and wheezing.   Cardiovascular: Positive for chest pain. Negative for palpitations.  Gastrointestinal: Negative for abdominal pain, blood in stool, constipation, diarrhea, nausea and vomiting.  Genitourinary: Negative for dysuria, hematuria and urgency.  Musculoskeletal: Negative for myalgias.  Skin: Negative for rash.  Neurological: Negative for dizziness, weakness and light-headedness.  Physical Exam Updated Vital Signs BP (!) 152/100   Pulse 87   Temp 99.3 F (37.4 C) (Oral)   Resp (!) 21   SpO2 93%   Physical Exam Vitals and nursing note reviewed.  Constitutional:      General: He is not in acute distress.    Appearance: He is well-developed.     Comments: On 4 L of oxygen by nasal cannula.  HENT:     Head: Normocephalic and atraumatic.     Nose: Nose normal.  Eyes:     General: No scleral icterus.       Left eye: No discharge.     Conjunctiva/sclera: Conjunctivae normal.  Cardiovascular:     Rate and Rhythm: Normal rate and regular rhythm.      Heart sounds: Normal heart sounds. No murmur. No friction rub. No gallop.   Pulmonary:     Effort: Pulmonary effort is normal. Tachypnea present. No respiratory distress.     Breath sounds: Normal breath sounds.  Abdominal:     General: Bowel sounds are normal. There is no distension.     Palpations: Abdomen is soft.     Tenderness: There is no abdominal tenderness. There is no guarding.  Musculoskeletal:        General: Normal range of motion.     Cervical back: Normal range of motion and neck supple.     Right lower leg: No tenderness. No edema.     Left lower leg: No tenderness. No edema.  Skin:    General: Skin is warm and dry.     Findings: No rash.  Neurological:     Mental Status: He is alert.     Motor: No abnormal muscle tone.     Coordination: Coordination normal.     ED Results / Procedures / Treatments   Labs (all labs ordered are listed, but only abnormal results are displayed) Labs Reviewed  BASIC METABOLIC PANEL - Abnormal; Notable for the following components:      Result Value   Glucose, Bld 109 (*)    Calcium 8.7 (*)    All other components within normal limits  CULTURE, BLOOD (ROUTINE X 2)  CULTURE, BLOOD (ROUTINE X 2)  CBC  LACTIC ACID, PLASMA  LACTIC ACID, PLASMA  D-DIMER, QUANTITATIVE (NOT AT [facility])  PROCALCITONIN  LACTATE DEHYDROGENASE  FERRITIN  TRIGLYCERIDES  FIBRINOGEN  C-REACTIVE PROTEIN  TROPONIN I (HIGH SENSITIVITY)    EKG EKG Interpretation  Date/Time:  Saturday May 21 2019 21:09:13 EST Ventricular Rate:  89 PR Interval:    QRS Duration: 90 QT Interval:  331 QTC Calculation: 403 R Axis:   48 Text Interpretation: Sinus rhythm Nonspecific T abnormalities, inferior leads No acute changes Nonspecific ST and T wave abnormality Confirmed by Derwood Kaplan [phone]) on 05/21/2019 9:24:45 PM   Radiology DG Chest Portable 1 View  Result Date: 05/21/2019 CLINICAL DATA:  Chest pain, shortness of breath EXAM: PORTABLE CHEST 1 VIEW  COMPARISON:  None FINDINGS: Multifocal patchy consolidative and mixed interstitial opacities with a basilar and peripheral predominance as well as more focal opacity in the right upper lung. Lung volumes overall are low. Cardiac silhouette is within normal limits for portable technique. No acute osseous or soft tissue abnormality. Telemetry leads overlie the chest. IMPRESSION: Features compatible with a multifocal infectious process in the appropriate clinical setting. Electronically Signed   By: Kreg Shropshire M.D.   On: 05/21/2019 21:24    Procedures .Critical Care Performed by: Dietrich Pates, PA-C  Authorized by: Delia Heady, PA-C   Critical care provider statement:    Critical care time (minutes):  35   Critical care time was exclusive of:  Separately billable procedures and treating other patients   Critical care was necessary to treat or prevent imminent or life-threatening deterioration of the following conditions:  Cardiac failure, circulatory failure, CNS failure or compromise, respiratory failure and sepsis   Critical care was time spent personally by me on the following activities:  Development of treatment plan with patient or surrogate, discussions with consultants, evaluation of patient's response to treatment, examination of patient, obtaining history from patient or surrogate, ordering and performing treatments and interventions, ordering and review of laboratory studies, ordering and review of radiographic studies, pulse oximetry, re-evaluation of patient's condition and review of old charts   I assumed direction of critical care for this patient from another provider in my specialty: no     (including critical care time)  Medications Ordered in ED Medications  amLODipine (NORVASC) tablet 5 mg (has no administration in time range)  dexamethasone (DECADRON) injection 6 mg (has no administration in time range)  sodium chloride flush (NS) 0.9 % injection 3 mL (3 mLs Intravenous Given  05/21/19 2122)    ED Course  I have reviewed the triage vital signs and the nursing notes.  Pertinent labs & imaging results that were available during my care of the patient were reviewed by me and considered in my medical decision making (see chart for details).  Clinical Course as of May 20 2157  Sat May 21, 2019  2118 On 4L oxygen via nasal cannula.  SpO2: 90 % [HK]    Clinical Course User Index [HK] Delia Heady, PA-C   MDM Rules/Calculators/A&P                      Brayden Brodhead was evaluated in Emergency Department on 05/21/19 for the symptoms described in the history of present illness. He/she was evaluated in the context of the global COVID-19 pandemic, which necessitated consideration that the patient might be at risk for infection with the SARS-CoV-2 virus that causes COVID-19. Institutional protocols and algorithms that pertain to the evaluation of patients at risk for COVID-19 are in a state of rapid change based on information released by regulatory bodies including the CDC and federal and state organizations. These policies and algorithms were followed during the patient's care in the ED.  54 year old male presenting to the ED with a chief complaint of shortness of breath.  He was diagnosed with Covid on his test 2 days ago at his PCPs office.  I can view this result on care everywhere.  He started having symptoms on 05/13/2019 but got worse this morning.  Wife has been checking his oxygen saturations and today found to be in the upper 80s.  He reports shortness of breath worse with exertion as well as central chest pressure and cough, chills.  He has a history of asthma.  Denies any leg swelling, history of DVT, PE or supplemental oxygen use at baseline.  Patient found to be hypoxic to 85% on room air in triage, placed on 4 L by nasal cannula.  States that this is made him more comfortable.  Chest x-ray shows findings consistent with Covid.  CBC, BMP unremarkable.  EKG without any  changes from prior tracings.  I personally reviewed and interpreted all imaging and lab work at today's visit.  Will order Decadron and admit to medicine service.  Patient and wife updated on plan.  He did not take his home antihypertensive today so we will order a dose of this.  Final Clinical Impression(s) / ED Diagnoses Final diagnoses:  COVID-19 virus infection  Hypoxia    Rx / DC Orders ED Discharge Orders    None      Portions of this note were generated with Dragon dictation software. Dictation errors may occur despite best attempts at proofreading.     Dietrich Pates, PA-C 05/21/19 2159    Derwood Kaplan, MD 05/22/19 [phone]

## 2019-05-21 NOTE — H&P (Signed)
History and Physical    Ganon Demasi UYQ:[id number] DOB: 21-Sep-1965 DOA: 05/21/2019  PCP: Hadley Pen, MD   Patient coming from: Home   Chief Complaint: Fever, cough, SOB   HPI: Nicolas Willis is a 54 y.o. male with medical history significant for allergic rhinitis and asthma, now presenting to the emergency department with worsening shortness of breath.  Patient reports that he had been in his usual state of health until 8 days ago when he developed a fever and generalized body aches.  Over the ensuing days, he developed sinus congestion, cough, and shortness of breath.  Over the past 24 hours, shortness of breath has worsened significantly.  He had seen his allergist in the clinic on 05/18/2019 and and was given a steroid injection at that time.  He was also tested for COVID-19 on 05/19/2019 that was positive.  Patient denies any leg swelling or tenderness but has had some pleuritic chest pain over the past day or so.  Denies hemoptysis.  Denies any significant GI symptoms or rash.  He had been checking his oxygen saturations at home and noted them to be in the upper 80s this evening.  ED Course: Upon arrival to the ED, patient is found to be afebrile, saturating mid to upper 80s on room air, tachypneic, and hypertensive.  EKG features a sinus rhythm with nonspecific ST-T abnormality.  Chest x-ray is concerning for multifocal pneumonia.  Chemistry panel and CBC are unremarkable, lactic acid level and high-sensitivity troponin are normal, and inflammatory markers have not yet resulted.  The patient was started on supplemental oxygen and treated with Norvasc and Decadron in the ED.  Review of Systems:  All other systems reviewed and apart from HPI, are negative.  Past Medical History:  Diagnosis Date  . Asthma   . Frequent sinus infections     Past Surgical History:  Procedure Laterality Date  . SINOSCOPY       reports that he has never smoked. He has never used smokeless tobacco. He  reports that he does not drink alcohol or use drugs.  No Known Allergies  Family History  Problem Relation Age of Onset  . Leukemia Mother   . Heart disease Maternal Grandmother   . Heart disease Maternal Grandfather      Prior to Admission medications   Medication Sig Start Date End Date Taking? Authorizing Provider  albuterol (PROVENTIL HFA) 108 (90 Base) MCG/ACT inhaler Inhale two puffs every four to six hours as needed for cough or wheeze. 05/16/19   Kozlow, Alvira Philips, MD  amLODipine (NORVASC) 5 MG tablet Take 1 tablet (5 mg total) by mouth daily. 08/12/18   [provider]  EPINEPHrine (EPIPEN 2-PAK) 0.3 mg/0.3 mL IJ SOAJ injection Use as directed for life threatening allergic reactions 11/01/15   Kozlow, Alvira Philips, MD  loratadine (CLARITIN) 10 MG tablet TAKE ONE TABLET BY MOUTH EVERY DAY 02/25/18   [provider]  mometasone (NASONEX) 50 MCG/ACT nasal spray Use one spray in each nostril once or twice daily 03/30/19   Kozlow, Alvira Philips, MD  montelukast (SINGULAIR) 10 MG tablet Take one tablet once daily as directed 03/24/19   Kozlow, Alvira Philips, MD  NUCALA 100 MG SOLR INJECT 100MG  SUBCUTANEOUSLY EVERY 4 WEEKS. 03/14/19   Kozlow, 03/16/19, MD  pantoprazole (PROTONIX) 40 MG tablet Take 40 mg by mouth daily. 04/29/19   [provider]    Physical Exam: Vitals:   05/21/19 07/21/19 05/21/19 2130 05/21/19 2145 05/21/19 2215  BP: (!) 154/97 (!) 173/145 (!) 152/100 (!) 150/97  Pulse: 87 87 87 86  Resp: (!) 22 (!) 28 (!) 21 (!) 24  Temp:      TempSrc:      SpO2: 93% 94% 93% 93%    Constitutional: NAD, calm  Eyes: PERTLA, lids and conjunctivae normal ENMT: Mucous membranes are moist. Posterior pharynx clear of any exudate or lesions.   Neck: normal, supple, no masses, no thyromegaly Respiratory: no wheezing, no crackles. Mild tachypnea. No pallor or cyanosis.  Cardiovascular: S1 & S2 heard, regular rate and rhythm. No extremity edema. Abdomen: No distension, no tenderness, soft.  Bowel sounds active.  Musculoskeletal: no clubbing / cyanosis. No joint deformity upper and lower extremities.   Skin: no significant rashes, lesions, ulcers. Warm, dry, well-perfused. Neurologic: No facial asymmetry. Sensation intact. Moving all extremities.  Psychiatric: Alert and oriented, appropriate throughout interview and exam. Pleasant and cooperative.    Labs and Imaging on Admission: I have personally reviewed following labs and imaging studies  CBC: Recent Labs  Lab 05/21/19 2056  WBC 7.4  HGB 15.6  HCT 47.3  MCV 91.1  PLT 282   Basic Metabolic Panel: Recent Labs  Lab 05/21/19 2056  NA 138  K 3.9  CL 103  CO2 23  GLUCOSE 109*  BUN 17  CREATININE 1.09  CALCIUM 8.7*   GFR: CrCl cannot be calculated (Unknown ideal weight.). Liver Function Tests: No results for input(s): AST, ALT, ALKPHOS, BILITOT, PROT, ALBUMIN in the last 168 hours. No results for input(s): LIPASE, AMYLASE in the last 168 hours. No results for input(s): AMMONIA in the last 168 hours. Coagulation Profile: No results for input(s): INR, PROTIME in the last 168 hours. Cardiac Enzymes: No results for input(s): CKTOTAL, CKMB, CKMBINDEX, TROPONINI in the last 168 hours. BNP (last 3 results) No results for input(s): PROBNP in the last 8760 hours. HbA1C: No results for input(s): HGBA1C in the last 72 hours. CBG: No results for input(s): GLUCAP in the last 168 hours. Lipid Profile: No results for input(s): CHOL, HDL, LDLCALC, TRIG, CHOLHDL, LDLDIRECT in the last 72 hours. Thyroid Function Tests: No results for input(s): TSH, T4TOTAL, FREET4, T3FREE, THYROIDAB in the last 72 hours. Anemia Panel: No results for input(s): VITAMINB12, FOLATE, FERRITIN, TIBC, IRON, RETICCTPCT in the last 72 hours. Urine analysis: No results found for: COLORURINE, APPEARANCEUR, LABSPEC, PHURINE, GLUCOSEU, HGBUR, BILIRUBINUR, KETONESUR, PROTEINUR, UROBILINOGEN, NITRITE, LEUKOCYTESUR Sepsis  Labs: @LABRCNTIP (procalcitonin:4,lacticidven:4) )No results found for this or any previous visit (from the past 240 hour(s)).   Radiological Exams on Admission: DG Chest Portable 1 View  Result Date: 05/21/2019 CLINICAL DATA:  Chest pain, shortness of breath EXAM: PORTABLE CHEST 1 VIEW COMPARISON:  None FINDINGS: Multifocal patchy consolidative and mixed interstitial opacities with a basilar and peripheral predominance as well as more focal opacity in the right upper lung. Lung volumes overall are low. Cardiac silhouette is within normal limits for portable technique. No acute osseous or soft tissue abnormality. Telemetry leads overlie the chest. IMPRESSION: Features compatible with a multifocal infectious process in the appropriate clinical setting. Electronically Signed   By: 07/21/2019 M.D.   On: 05/21/2019 21:24    EKG: Independently reviewed. Sinus rhythm, non-specific ST-T abnormality.   Assessment/Plan  1. COVID-19 with pneumonia and acute hypoxic respiratory failure  - Presents with fevers, aches, cough, and SOB since 2/26, now with worsening dyspnea, hypoxia at home, and pleuritic chest pain  - Found to be positive for COVID on 3/4 (see Care  Everywhere), hypoxic in ED and requiring 4 Lpm to maintain saturation of low 90's, and with CXR concerning for multifocal PNA  - Pleuritic pain could be explained by COVID pneumonia but PE remains on Ddx; no hemoptysis or evidence for DVT, d-dimer pending - Continue Decadron and supplemental O2, start remdesivir, follow-up pending CRP, procalcitonin, and d-dimer    2. Hypertension with hypertensive urgency  - DBP in 100's in ED, patient had taken his Norvasc today and that was given in ED   - Continue Norvasc, use labetalol IVP as needed   3. Asthma, allergic rhinitis   - Continue albuterol, intranasal and systemic steroids, Singulair, Claritin    DVT prophylaxis: Lovenox  Code Status: Full  Family Communication: Discussed with patient   Disposition Plan: Likely back home once respiratory status improved and stable  Consults called: None  Admission status: Inpatient    Vianne Bulls, MD [facility] Pager: See [email or web address]  If 7AM-7PM, please contact the daytime attending [email or web address]  05/21/2019, 10:29 PM

## 2019-05-22 LAB — CBC WITH DIFFERENTIAL/PLATELET
Abs Immature Granulocytes: 0.06 10*3/uL (ref 0.00–0.07)
Basophils Absolute: 0 10*3/uL (ref 0.0–0.1)
Basophils Relative: 0 %
Eosinophils Absolute: 0 10*3/uL (ref 0.0–0.5)
Eosinophils Relative: 0 %
HCT: 42.9 % (ref 39.0–52.0)
Hemoglobin: 14.6 g/dL (ref 13.0–17.0)
Immature Granulocytes: 1 %
Lymphocytes Relative: 5 %
Lymphs Abs: 0.4 10*3/uL — ABNORMAL LOW (ref 0.7–4.0)
MCH: 30.2 pg (ref 26.0–34.0)
MCHC: 34 g/dL (ref 30.0–36.0)
MCV: 88.8 fL (ref 80.0–100.0)
Monocytes Absolute: 0.2 10*3/uL (ref 0.1–1.0)
Monocytes Relative: 3 %
Neutro Abs: 6.8 10*3/uL (ref 1.7–7.7)
Neutrophils Relative %: 91 %
Platelets: 260 10*3/uL (ref 150–400)
RBC: 4.83 MIL/uL (ref 4.22–5.81)
RDW: 11.5 % (ref 11.5–15.5)
WBC: 7.5 10*3/uL (ref 4.0–10.5)
nRBC: 0 % (ref 0.0–0.2)

## 2019-05-22 LAB — COMPREHENSIVE METABOLIC PANEL
ALT: 57 U/L — ABNORMAL HIGH (ref 0–44)
AST: 46 U/L — ABNORMAL HIGH (ref 15–41)
Albumin: 3.4 g/dL — ABNORMAL LOW (ref 3.5–5.0)
Alkaline Phosphatase: 105 U/L (ref 38–126)
Anion gap: 11 (ref 5–15)
BUN: 14 mg/dL (ref 6–20)
CO2: 20 mmol/L — ABNORMAL LOW (ref 22–32)
Calcium: 8.6 mg/dL — ABNORMAL LOW (ref 8.9–10.3)
Chloride: 105 mmol/L (ref 98–111)
Creatinine, Ser: 1.03 mg/dL (ref 0.61–1.24)
GFR calc Af Amer: >60 mL/min (ref >60)
GFR calc non Af Amer: >60 mL/min (ref >60)
Glucose, Bld: 127 mg/dL — ABNORMAL HIGH (ref 70–99)
Potassium: 3.9 mmol/L (ref 3.5–5.1)
Sodium: 136 mmol/L (ref 135–145)
Total Bilirubin: 0.8 mg/dL (ref 0.3–1.2)
Total Protein: 6.8 g/dL (ref 6.5–8.1)

## 2019-05-22 LAB — ABO/RH: ABO/RH(D): O POS

## 2019-05-22 LAB — FERRITIN: Ferritin: 552 ng/mL — ABNORMAL HIGH (ref 24–336)

## 2019-05-22 LAB — TROPONIN I (HIGH SENSITIVITY): Troponin I (High Sensitivity): 9 ng/L (ref <18)

## 2019-05-22 LAB — D-DIMER, QUANTITATIVE: D-Dimer, Quant: 0.7 ug/mL-FEU — ABNORMAL HIGH (ref 0.00–0.50)

## 2019-05-22 LAB — MAGNESIUM: Magnesium: 1.7 mg/dL (ref 1.7–2.4)

## 2019-05-22 LAB — HIV ANTIBODY (ROUTINE TESTING W REFLEX): HIV Screen 4th Generation wRfx: NONREACTIVE

## 2019-05-22 LAB — C-REACTIVE PROTEIN: CRP: 1.8 mg/dL — ABNORMAL HIGH (ref <1.0)

## 2019-05-22 MED ORDER — ALBUTEROL SULFATE HFA 108 (90 BASE) MCG/ACT IN AERS
2.0000 | INHALATION_SPRAY | Freq: Four times a day (QID) | RESPIRATORY_TRACT | Status: DC
  Administered 2019-05-22 – 2019-05-25 (×13): 2 via RESPIRATORY_TRACT

## 2019-05-22 MED ORDER — ENSURE ENLIVE PO LIQD
237.0000 mL | Freq: Two times a day (BID) | ORAL | Status: DC
  Administered 2019-05-22 – 2019-05-23 (×3): 237 mL via ORAL

## 2019-05-22 MED ORDER — INFLUENZA VAC SPLIT QUAD 0.5 ML IM SUSY
0.5000 mL | PREFILLED_SYRINGE | INTRAMUSCULAR | Status: DC
  Filled 2019-05-22: qty 0.5

## 2019-05-22 MED ORDER — FLUTICASONE PROPIONATE 50 MCG/ACT NA SUSP: 1.0000 | Freq: Every day | NASAL | Status: DC

## 2019-05-22 MED ORDER — ORAL CARE MOUTH RINSE
15.0000 mL | Freq: Two times a day (BID) | OROMUCOSAL | Status: DC
  Administered 2019-05-22 – 2019-05-24 (×6): 15 mL via OROMUCOSAL

## 2019-05-22 MED ORDER — FLUTICASONE PROPIONATE 50 MCG/ACT NA SUSP
1.0000 | Freq: Every day | NASAL | Status: DC
  Administered 2019-05-22 – 2019-05-25 (×4): 1 via NASAL

## 2019-05-22 NOTE — Progress Notes (Signed)
PROGRESS NOTE                                                                                                                                                                                                             Patient Demographics:    Nicolas Willis, is a 54 y.o. male, DOB - [birth year], EAV:[id number]  Outpatient Primary MD for the patient is Hadley Pen, MD    LOS - 1  Admit date - 05/21/2019    Chief Complaint  Patient presents with  . Shortness of Breath       Brief Narrative   Nicolas Willis is a 53 y.o. male with medical history significant for allergic rhinitis and asthma, now presenting to the emergency department with worsening shortness of breath, positive for Covid on 05/19/2019 and subsequently started getting more short of breath.  He was diagnosed with COVID-19 pneumonia admitted to the hospital.   Subjective:    Nicolas Willis today has, No headache, No chest pain, No abdominal pain - No Nausea, No new weakness tingling or numbness, mild SOB.     Assessment  & Plan :      1. Acute Hypoxic Resp. Failure due to Acute Covid 19 Viral Pneumonitis during the ongoing 2020 Covid 19 Pandemic - he has moderate disease and has been started on IV steroids and remdesivir, clinically improving, will advance activity and titrate down oxygen.  Complete IV remdesivir course and then discharged home.  In case he gets worse consent for Actemra has been obtained.  Encouraged the patient to sit up in chair in the daytime use I-S and flutter valve for pulmonary toiletry and then prone in bed when at night.  Actemra off label use - patient was told that if COVID-19 pneumonitis gets worse we might potentially use Actemra off label, patient denies any known history of tuberculosis or hepatitis, understands the risks and benefits and wants to proceed with Actemra treatment if required.     SpO2: 90 % O2 Flow Rate (L/min):  3 L/min  Recent Labs  Lab 05/21/19 2056 05/22/19 0004  CRP 3.3* 1.8*  DDIMER 0.72* 0.70*  FERRITIN 563* 552*  PROCALCITON <0.10  --      2.  Asthma and rhinitis.  Supportive care.  No wheezing.  3.  GERD.  PPI.   4.  Essential  hypertension.  Stable on Norvasc.    Condition - FairExtremely Guarded  Family Communication  :  None  Code Status :  Full  Diet :   Diet Order            Diet Heart Room service appropriate? Yes; Fluid consistency: Thin  Diet effective now               Disposition Plan  :  Home after he finishes his IV remdesivir course for COVID-19 pneumonia  Consults  :  None  Procedures  :  None  PUD Prophylaxis : PPI  DVT Prophylaxis  :  Lovenox    Lab Results  Component Value Date   PLT 260 05/22/2019    Inpatient Medications  Scheduled Meds: . albuterol  2 puff Inhalation QID  . amLODipine  5 mg Oral QHS  . dexamethasone (DECADRON) injection  6 mg Intravenous Q24H  . enoxaparin (LOVENOX) injection  40 mg Subcutaneous QHS  . feeding supplement (ENSURE ENLIVE)  237 mL Oral BID BM  . fluticasone  1 spray Each Nare Daily  . [START ON 05/23/2019] influenza vac split quadrivalent PF  0.5 mL Intramuscular Tomorrow-1000  . mouth rinse  15 mL Mouth Rinse BID  . montelukast  10 mg Oral QHS  . pantoprazole  40 mg Oral QHS  . [START ON 05/23/2019] pneumococcal 23 valent vaccine  0.5 mL Intramuscular Tomorrow-1000   Continuous Infusions: . remdesivir 100 mg in NS 100 mL 100 mg (05/22/19 1023)   PRN Meds:.acetaminophen, HYDROcodone-acetaminophen, labetalol, loratadine, [DISCONTINUED] ondansetron **OR** ondansetron (ZOFRAN) IV, senna-docusate, sodium chloride  Antibiotics  :    Anti-infectives (From admission, onward)   Start     Dose/Rate Route Frequency Ordered Stop   05/22/19 1000  remdesivir 100 mg in sodium chloride 0.9 % 100 mL IVPB  Status:  Discontinued     100 mg 200 mL/hr over 30 Minutes Intravenous Daily 05/21/19 2225 05/21/19 2226     05/22/19 1000  remdesivir 100 mg in sodium chloride 0.9 % 100 mL IVPB     100 mg 200 mL/hr over 30 Minutes Intravenous Daily 05/21/19 2227 05/26/19 0959   05/21/19 2300  remdesivir 100 mg in sodium chloride 0.9 % 100 mL IVPB     100 mg 200 mL/hr over 30 Minutes Intravenous Every 1 hr x 2 05/21/19 2227 05/22/19 0114   05/21/19 2230  remdesivir 200 mg in sodium chloride 0.9% 250 mL IVPB  Status:  Discontinued     200 mg 580 mL/hr over 30 Minutes Intravenous Once 05/21/19 2225 05/21/19 2226       Time Spent in minutes  30   Susa Raring M.D on 05/22/2019 at 11:26 AM  To page go to [email or web address] - password [facility]  [facility] -  Office  [phone]  See all Orders from today for further details    Objective:   Vitals:   05/22/19 0000 05/22/19 0315 05/22/19 0320 05/22/19 0719  BP:    (!) 122/91  Pulse:    75  Resp:    (!) 22  Temp:   98.4 F (36.9 C) 98.2 F (36.8 C)  TempSrc:   Oral Oral  SpO2: 92% (!) 86% 92% 90%  Weight:      Height:        Wt Readings from Last 3 Encounters:  05/21/19 98 kg  08/16/18 97.9 kg  02/24/18 94.7 kg     Intake/Output Summary (Last 24 hours) at 05/22/2019 1126 Last data  filed at 05/22/2019 0330 Gross per 24 hour  Intake 259.72 ml  Output 250 ml  Net 9.72 ml     Physical Exam  Awake Alert, No new F.N deficits, Normal affect .AT,PERRAL Supple Neck,No JVD, No cervical lymphadenopathy appriciated.  Symmetrical Chest wall movement, Good air movement bilaterally, CTAB RRR,No Gallops,Rubs or new Murmurs, No Parasternal Heave +ve B.Sounds, Abd Soft, No tenderness, No organomegaly appriciated, No rebound - guarding or rigidity. No Cyanosis, Clubbing or edema, No new Rash or bruise       Data Review:    CBC Recent Labs  Lab 05/21/19 2056 05/22/19 0004  WBC 7.4 7.5  HGB 15.6 14.6  HCT 47.3 42.9  PLT 282 260  MCV 91.1 88.8  MCH 30.1 30.2  MCHC 33.0 34.0  RDW 11.6 11.5  LYMPHSABS  --  0.4*  MONOABS  --  0.2   EOSABS  --  0.0  BASOSABS  --  0.0    Chemistries  Recent Labs  Lab 05/21/19 2056 05/22/19 0004  NA 138 136  K 3.9 3.9  CL 103 105  CO2 23 20*  GLUCOSE 109* 127*  BUN 17 14  CREATININE 1.09 1.03  CALCIUM 8.7* 8.6*  MG  --  1.7  AST  --  46*  ALT  --  57*  ALKPHOS  --  105  BILITOT  --  0.8   ------------------------------------------------------------------------------------------------------------------ Recent Labs    05/21/19 2120  TRIG 333*    No results found for: HGBA1C ------------------------------------------------------------------------------------------------------------------ No results for input(s): TSH, T4TOTAL, T3FREE, THYROIDAB in the last 72 hours.  Invalid input(s): FREET3  Cardiac Enzymes No results for input(s): CKMB, TROPONINI, MYOGLOBIN in the last 168 hours.  Invalid input(s): CK ------------------------------------------------------------------------------------------------------------------ No results found for: BNP  Micro Results Recent Results (from the past 240 hour(s))  Blood Culture (routine x 2)     Status: None (Preliminary result)   Collection Time: 05/21/19  9:21 PM   Specimen: BLOOD  Result Value Ref Range Status   Specimen Description BLOOD RIGHT ANTECUBITAL  Final   Special Requests   Final    BOTTLES DRAWN AEROBIC AND ANAEROBIC Blood Culture adequate volume   Culture   Final    NO GROWTH < 12 HOURS Performed at [facility], [address], [place], [place] [address]    Report Status PENDING  Incomplete  Blood Culture (routine x 2)     Status: None (Preliminary result)   Collection Time: 05/21/19  9:30 PM   Specimen: BLOOD LEFT FOREARM  Result Value Ref Range Status   Specimen Description BLOOD LEFT FOREARM  Final   Special Requests   Final    BOTTLES DRAWN AEROBIC AND ANAEROBIC Blood Culture results may not be optimal due to an inadequate volume of blood received in culture bottles   Culture   Final    NO  GROWTH < 12 HOURS Performed at [facility], [address], [place], [place] [address]    Report Status PENDING  Incomplete    Radiology Reports DG Chest Portable 1 View  Result Date: 05/21/2019 CLINICAL DATA:  Chest pain, shortness of breath EXAM: PORTABLE CHEST 1 VIEW COMPARISON:  None FINDINGS: Multifocal patchy consolidative and mixed interstitial opacities with a basilar and peripheral predominance as well as more focal opacity in the right upper lung. Lung volumes overall are low. Cardiac silhouette is within normal limits for portable technique. No acute osseous or soft tissue abnormality. Telemetry leads overlie the chest.  IMPRESSION: Features compatible with a multifocal infectious process in the appropriate clinical setting. Electronically Signed   By: Lovena Le M.D.   On: 05/21/2019 21:24

## 2019-05-22 NOTE — Progress Notes (Signed)
   05/22/19 0315  Oxygen Therapy  SpO2 (!) 86 %  O2 Device Room Air  Patient Activity (if Appropriate) Ambulating  Pulse Oximetry Type Intermittent   Patient ambulated from bed, to bathroom, to bed, SOB. O2 3L [place] re-applied. HR returned to 92% on O2 3L [place] while resting in bed. Patient encouraged to ambulate in room, even if just around the bed d.t O2 tubing. Will continue to monitor.

## 2019-05-22 NOTE — Progress Notes (Signed)
Patient admitted to unit from ED. Patient A&O x4, status update given. Questions answered. Patient verbalized understanding. Patient pleasant and cooperative with assessment and meds. Patient does not want meal or HS snack. Patient able to reposition self at this time. This RN educate patient on IS use, Flutter valve use, importance of ambulating in room and hallway with assistance, and importance of proning. Patient verbalizes understanding. Patient resting in bed at this time, call light within reach. Will continue to monitor.

## 2019-05-23 LAB — CBC WITH DIFFERENTIAL/PLATELET
Abs Immature Granulocytes: 0.08 10*3/uL — ABNORMAL HIGH (ref 0.00–0.07)
Basophils Absolute: 0 10*3/uL (ref 0.0–0.1)
Basophils Relative: 0 %
Eosinophils Absolute: 0 10*3/uL (ref 0.0–0.5)
Eosinophils Relative: 0 %
HCT: 42.7 % (ref 39.0–52.0)
Hemoglobin: 14.7 g/dL (ref 13.0–17.0)
Immature Granulocytes: 1 %
Lymphocytes Relative: 11 %
Lymphs Abs: 0.8 10*3/uL (ref 0.7–4.0)
MCH: 30.2 pg (ref 26.0–34.0)
MCHC: 34.4 g/dL (ref 30.0–36.0)
MCV: 87.9 fL (ref 80.0–100.0)
Monocytes Absolute: 0.4 10*3/uL (ref 0.1–1.0)
Monocytes Relative: 6 %
Neutro Abs: 5.8 10*3/uL (ref 1.7–7.7)
Neutrophils Relative %: 82 %
Platelets: 273 10*3/uL (ref 150–400)
RBC: 4.86 MIL/uL (ref 4.22–5.81)
RDW: 11.3 % — ABNORMAL LOW (ref 11.5–15.5)
WBC: 7.1 10*3/uL (ref 4.0–10.5)
nRBC: 0 % (ref 0.0–0.2)

## 2019-05-23 LAB — D-DIMER, QUANTITATIVE: D-Dimer, Quant: 0.52 ug/mL-FEU — ABNORMAL HIGH (ref 0.00–0.50)

## 2019-05-23 LAB — COMPREHENSIVE METABOLIC PANEL
ALT: 66 U/L — ABNORMAL HIGH (ref 0–44)
AST: 46 U/L — ABNORMAL HIGH (ref 15–41)
Albumin: 3.2 g/dL — ABNORMAL LOW (ref 3.5–5.0)
Alkaline Phosphatase: 95 U/L (ref 38–126)
Anion gap: 13 (ref 5–15)
BUN: 24 mg/dL — ABNORMAL HIGH (ref 6–20)
CO2: 22 mmol/L (ref 22–32)
Calcium: 9 mg/dL (ref 8.9–10.3)
Chloride: 103 mmol/L (ref 98–111)
Creatinine, Ser: 1.09 mg/dL (ref 0.61–1.24)
GFR calc Af Amer: >60 mL/min (ref >60)
GFR calc non Af Amer: >60 mL/min (ref >60)
Glucose, Bld: 149 mg/dL — ABNORMAL HIGH (ref 70–99)
Potassium: 4.3 mmol/L (ref 3.5–5.1)
Sodium: 138 mmol/L (ref 135–145)
Total Bilirubin: 0.9 mg/dL (ref 0.3–1.2)
Total Protein: 6.6 g/dL (ref 6.5–8.1)

## 2019-05-23 LAB — PROCALCITONIN: Procalcitonin: <0.1 ng/mL

## 2019-05-23 LAB — BRAIN NATRIURETIC PEPTIDE: B Natriuretic Peptide: 47.8 pg/mL (ref 0.0–100.0)

## 2019-05-23 LAB — C-REACTIVE PROTEIN: CRP: 0.9 mg/dL (ref <1.0)

## 2019-05-23 MED ORDER — ENSURE ENLIVE PO LIQD: 237.0000 mL | ORAL | Status: DC

## 2019-05-23 NOTE — Progress Notes (Signed)
PROGRESS NOTE                                                                                                                                                                                                             Patient Demographics:    Nicolas Willis, is a 54 y.o. male, DOB - [birth year], MEQ:[id number]  Outpatient Primary MD for the patient is Nicolas Pen, MD    LOS - 2  Admit date - 05/21/2019    Chief Complaint  Patient presents with  . Shortness of Breath       Brief Narrative   Nicolas Willis is a 54 y.o. male with medical history significant for allergic rhinitis and asthma, now presenting to the emergency department with worsening shortness of breath, positive for Covid on 05/19/2019 and subsequently started getting more short of breath.  He was diagnosed with COVID-19 pneumonia admitted to the hospital.   Subjective:   Patient in room, denies any headache chest or abdominal pain, shortness of breath is much improved.   Assessment  & Plan :      1. Acute Hypoxic Resp. Failure due to Acute Covid 19 Viral Pneumonitis during the ongoing 2020 Covid 19 Pandemic - he has moderate disease and has been started on IV steroids and remdesivir, clinically improving, will advance activity and titrate down oxygen.  Complete IV remdesivir course and then discharged home.  Medically he is much improved and feels a whole lot better on 05/23/2019.  Encouraged the patient to sit up in chair in the daytime use I-S and flutter valve for pulmonary toiletry and then prone in bed when at night.    Recent Labs  Lab 05/21/19 2056 05/22/19 0004 05/23/19 0248  CRP 3.3* 1.8* 0.9  DDIMER 0.72* 0.70* 0.52*  FERRITIN 563* 552*  --   BNP  --   --  47.8  PROCALCITON <0.10  --  <0.10     2.  Asthma and rhinitis.  Supportive care.  No wheezing.  3.  GERD.  PPI.   4.  Essential hypertension.  Stable on Norvasc.    Condition -  FairExtremely Guarded  Family Communication  : Wife Nicolas Willis over the phone on 05/23/2019  Code Status :  Full  Diet :   Diet Order  Diet Heart Room service appropriate? Yes; Fluid consistency: Thin  Diet effective now               Disposition Plan  :  Home after he finishes his IV remdesivir course for COVID-19 pneumonia  Consults  :  None  Procedures  :  None  PUD Prophylaxis : PPI  DVT Prophylaxis  :  Lovenox    Lab Results  Component Value Date   PLT 273 05/23/2019    Inpatient Medications  Scheduled Meds: . albuterol  2 puff Inhalation QID  . amLODipine  5 mg Oral QHS  . dexamethasone (DECADRON) injection  6 mg Intravenous Q24H  . enoxaparin (LOVENOX) injection  40 mg Subcutaneous QHS  . feeding supplement (ENSURE ENLIVE)  237 mL Oral BID BM  . fluticasone  1 spray Each Nare Daily  . influenza vac split quadrivalent PF  0.5 mL Intramuscular Tomorrow-1000  . mouth rinse  15 mL Mouth Rinse BID  . montelukast  10 mg Oral QHS  . pantoprazole  40 mg Oral QHS  . pneumococcal 23 valent vaccine  0.5 mL Intramuscular Tomorrow-1000   Continuous Infusions: . remdesivir 100 mg in NS 100 mL 100 mg (05/23/19 0923)   PRN Meds:.acetaminophen, HYDROcodone-acetaminophen, labetalol, loratadine, [DISCONTINUED] ondansetron **OR** ondansetron (ZOFRAN) IV, senna-docusate, sodium chloride  Antibiotics  :    Anti-infectives (From admission, onward)   Start     Dose/Rate Route Frequency Ordered Stop   05/22/19 1000  remdesivir 100 mg in sodium chloride 0.9 % 100 mL IVPB  Status:  Discontinued     100 mg 200 mL/hr over 30 Minutes Intravenous Daily 05/21/19 2225 05/21/19 2226   05/22/19 1000  remdesivir 100 mg in sodium chloride 0.9 % 100 mL IVPB     100 mg 200 mL/hr over 30 Minutes Intravenous Daily 05/21/19 2227 05/26/19 0959   05/21/19 2300  remdesivir 100 mg in sodium chloride 0.9 % 100 mL IVPB     100 mg 200 mL/hr over 30 Minutes Intravenous Every 1 hr x 2  05/21/19 2227 05/22/19 0114   05/21/19 2230  remdesivir 200 mg in sodium chloride 0.9% 250 mL IVPB  Status:  Discontinued     200 mg 580 mL/hr over 30 Minutes Intravenous Once 05/21/19 2225 05/21/19 2226       Time Spent in minutes  30   Lala Lund M.D on 05/23/2019 at 10:21 AM  To page go to [email or web address] - password [facility]  [facility] -  Office  [phone]  See all Orders from today for further details    Objective:   Vitals:   05/22/19 0719 05/22/19 1242 05/22/19 2111 05/23/19 0542  BP: (!) 122/91 133/90 (!) 142/90 119/86  Pulse: 75 78 76 68  Resp: (!) 22 (!) 21 20 19   Temp: 98.2 F (36.8 C) 98.3 F (36.8 C) 98.3 F (36.8 C) 98.1 F (36.7 C)  TempSrc: Oral Oral Oral Oral  SpO2: 90% 95% 92% 94%  Weight:      Height:        Wt Readings from Last 3 Encounters:  05/21/19 98 kg  08/16/18 97.9 kg  02/24/18 94.7 kg     Intake/Output Summary (Last 24 hours) at 05/23/2019 1021 Last data filed at 05/23/2019 0923 Gross per 24 hour  Intake 490 ml  Output --  Net 490 ml     Physical Exam  Awake Alert, No new F.N deficits, Normal affect [place].AT,PERRAL Supple Neck,No JVD, No cervical lymphadenopathy appriciated.  Symmetrical Chest wall movement, Good air movement bilaterally, CTAB RRR,No Gallops, Rubs or new Murmurs, No Parasternal Heave +ve B.Sounds, Abd Soft, No tenderness, No organomegaly appriciated, No rebound - guarding or rigidity. No Cyanosis, Clubbing or edema, No new Rash or bruise    Data Review:    CBC Recent Labs  Lab 05/21/19 2056 05/22/19 0004 05/23/19 0248  WBC 7.4 7.5 7.1  HGB 15.6 14.6 14.7  HCT 47.3 42.9 42.7  PLT 282 260 273  MCV 91.1 88.8 87.9  MCH 30.1 30.2 30.2  MCHC 33.0 34.0 34.4  RDW 11.6 11.5 11.3*  LYMPHSABS  --  0.4* 0.8  MONOABS  --  0.2 0.4  EOSABS  --  0.0 0.0  BASOSABS  --  0.0 0.0    Chemistries  Recent Labs  Lab 05/21/19 2056 05/22/19 0004 05/23/19 0248  NA 138 136 138  K 3.9 3.9 4.3  CL 103 105  103  CO2 23 20* 22  GLUCOSE 109* 127* 149*  BUN 17 14 24*  CREATININE 1.09 1.03 1.09  CALCIUM 8.7* 8.6* 9.0  MG  --  1.7  --   AST  --  46* 46*  ALT  --  57* 66*  ALKPHOS  --  105 95  BILITOT  --  0.8 0.9   ------------------------------------------------------------------------------------------------------------------ Recent Labs    05/21/19 2120  TRIG 333*    No results found for: HGBA1C ------------------------------------------------------------------------------------------------------------------ No results for input(s): TSH, T4TOTAL, T3FREE, THYROIDAB in the last 72 hours.  Invalid input(s): FREET3  Cardiac Enzymes No results for input(s): CKMB, TROPONINI, MYOGLOBIN in the last 168 hours.  Invalid input(s): CK ------------------------------------------------------------------------------------------------------------------    Component Value Date/Time   BNP 47.8 05/23/2019 0248    Micro Results Recent Results (from the past 240 hour(s))  Blood Culture (routine x 2)     Status: None (Preliminary result)   Collection Time: 05/21/19  9:21 PM   Specimen: BLOOD  Result Value Ref Range Status   Specimen Description BLOOD RIGHT ANTECUBITAL  Final   Special Requests   Final    BOTTLES DRAWN AEROBIC AND ANAEROBIC Blood Culture adequate volume   Culture   Final    NO GROWTH 2 DAYS Performed at [facility], [address], [place], [place] [address]    Report Status PENDING  Incomplete  Blood Culture (routine x 2)     Status: None (Preliminary result)   Collection Time: 05/21/19  9:30 PM   Specimen: BLOOD LEFT FOREARM  Result Value Ref Range Status   Specimen Description BLOOD LEFT FOREARM  Final   Special Requests   Final    BOTTLES DRAWN AEROBIC AND ANAEROBIC Blood Culture results may not be optimal due to an inadequate volume of blood received in culture bottles   Culture   Final    NO GROWTH 2 DAYS Performed at [facility], [address] [place], [place] [address]    Report Status PENDING  Incomplete    Radiology Reports DG Chest Portable 1 View  Result Date: 05/21/2019 CLINICAL DATA:  Chest pain, shortness of breath EXAM: PORTABLE CHEST 1 VIEW COMPARISON:  None FINDINGS: Multifocal patchy consolidative and mixed interstitial opacities with a basilar and peripheral predominance as well as more focal opacity in the right upper lung. Lung volumes overall are low. Cardiac silhouette is within normal limits for portable technique. No acute osseous or soft tissue abnormality. Telemetry leads overlie the chest. IMPRESSION: Features compatible with a multifocal infectious process  in the appropriate clinical setting. Electronically Signed   By: Kreg Shropshire M.D.   On: 05/21/2019 21:24

## 2019-05-23 NOTE — Progress Notes (Signed)
Initial Nutrition Assessment  RD working remotely.  DOCUMENTATION CODES:   Obesity unspecified  INTERVENTION:   -MVI with minerals daily -Decrease Ensure Enlive po to daily, each supplement provides 350 kcal and 20 grams of protein -Magic cup TID with meals, each supplement provides 290 kcal and 9 grams of protein  NUTRITION DIAGNOSIS:   Increased nutrient needs related to acute illness(COVID-19) as evidenced by estimated needs.  GOAL:   Patient will meet greater than or equal to 90% of their needs  MONITOR:   PO intake, Supplement acceptance, Weight trends, Skin, Labs, I & O's  REASON FOR ASSESSMENT:   Malnutrition Screening Tool    ASSESSMENT:   Nicolas Willis is a 54 y.o. male with medical history significant for allergic rhinitis and asthma, now presenting to the emergency department with worsening shortness of breath.  Patient reports that he had been in his usual state of health until 8 days ago when he developed a fever and generalized body aches.  Over the ensuing days, he developed sinus congestion, cough, and shortness of breath.  Over the past 24 hours, shortness of breath has worsened significantly.  He had seen his allergist in the clinic on 05/18/2019 and and was given a steroid injection at that time.  He was also tested for COVID-19 on 05/19/2019 that was positive.  Patient denies any leg swelling or tenderness but has had some pleuritic chest pain over the past day or so.  Denies hemoptysis.  Denies any significant GI symptoms or rash.  He had been checking his oxygen saturations at home and noted them to be in the upper 80s this evening.  Pt admitted with COVID-19 with pneumonia and acute hypoxic respiratory failure.   Reviewed I/O's: +730 ml x 24 hours and +740 ml since admission  Attempted to speak with pt via phone, however, no answer.  Per MD notes, pt's respiratory status has greatly improved since admission.  Pt with good oral intake since admission;  noted meal completions documented at 50-100%. Pt is also receiving Ensure supplements; refused this morning's dose.   Reviewed wt hx; noted wt has been stable over the past 3 years.  Albumin has a half-life of 21 days and is strongly affected by stress response and inflammatory process, therefore, do not expect to see an improvement in this lab value during acute hospitalization. When a patient presents with low albumin, it is likely skewed due to the acute inflammatory response. Note that low albumin is no longer used to diagnose malnutrition; [facility] uses the new malnutrition guidelines published by the American Society for Parenteral and Enteral Nutrition (A.S.P.E.N.) and the Academy of Nutrition and Dietetics (AND).    Labs reviewed.   Diet Order:   Diet Order            Diet Heart Room service appropriate? Yes; Fluid consistency: Thin  Diet effective now              EDUCATION NEEDS:   No education needs have been identified at this time  Skin:  Skin Assessment: Reviewed RN Assessment  Last BM:  Unknown  Height:   Ht Readings from Last 1 Encounters:  05/21/19 5\' 10"  (1.778 m)    Weight:   Wt Readings from Last 1 Encounters:  05/21/19 98 kg    Ideal Body Weight:  75.5 kg  BMI:  Body mass index is 30.99 kg/m.  Estimated Nutritional Needs:   Kcal:  2250-2450  Protein:  115-130 grams  Fluid:  >  2.2 L    Levada Schilling, RD, LDN, CDCES Registered Dietitian II Certified Diabetes Care and Education Specialist Please refer to [facility] for RD and/or RD on-call/weekend/after hours pager

## 2019-05-23 NOTE — Plan of Care (Signed)

## 2019-05-23 NOTE — Progress Notes (Signed)
Patient is independent in his room.  He has not needed the nasal cannula for most of the afternoon/evening.  After ambulating in the room the patient will use the nasal cannula to catch his breath and remove it once he's recovered.

## 2019-05-24 LAB — C-REACTIVE PROTEIN: CRP: 0.7 mg/dL (ref <1.0)

## 2019-05-24 LAB — COMPREHENSIVE METABOLIC PANEL
ALT: 66 U/L — ABNORMAL HIGH (ref 0–44)
AST: 40 U/L (ref 15–41)
Albumin: 3.3 g/dL — ABNORMAL LOW (ref 3.5–5.0)
Alkaline Phosphatase: 87 U/L (ref 38–126)
Anion gap: 12 (ref 5–15)
BUN: 25 mg/dL — ABNORMAL HIGH (ref 6–20)
CO2: 22 mmol/L (ref 22–32)
Calcium: 9 mg/dL (ref 8.9–10.3)
Chloride: 104 mmol/L (ref 98–111)
Creatinine, Ser: 1.04 mg/dL (ref 0.61–1.24)
GFR calc Af Amer: >60 mL/min (ref >60)
GFR calc non Af Amer: >60 mL/min (ref >60)
Glucose, Bld: 144 mg/dL — ABNORMAL HIGH (ref 70–99)
Potassium: 4.7 mmol/L (ref 3.5–5.1)
Sodium: 138 mmol/L (ref 135–145)
Total Bilirubin: 0.9 mg/dL (ref 0.3–1.2)
Total Protein: 6.6 g/dL (ref 6.5–8.1)

## 2019-05-24 LAB — BRAIN NATRIURETIC PEPTIDE: B Natriuretic Peptide: 45.8 pg/mL (ref 0.0–100.0)

## 2019-05-24 LAB — CBC WITH DIFFERENTIAL/PLATELET
Abs Immature Granulocytes: 0 10*3/uL (ref 0.00–0.07)
Basophils Absolute: 0 10*3/uL (ref 0.0–0.1)
Basophils Relative: 0 %
Eosinophils Absolute: 0 10*3/uL (ref 0.0–0.5)
Eosinophils Relative: 0 %
HCT: 44.3 % (ref 39.0–52.0)
Hemoglobin: 14.8 g/dL (ref 13.0–17.0)
Lymphocytes Relative: 8 %
Lymphs Abs: 0.6 10*3/uL — ABNORMAL LOW (ref 0.7–4.0)
MCH: 30.2 pg (ref 26.0–34.0)
MCHC: 33.4 g/dL (ref 30.0–36.0)
MCV: 90.4 fL (ref 80.0–100.0)
Monocytes Absolute: 0.1 10*3/uL (ref 0.1–1.0)
Monocytes Relative: 1 %
Neutro Abs: 6.3 10*3/uL (ref 1.7–7.7)
Neutrophils Relative %: 91 %
Platelets: 315 10*3/uL (ref 150–400)
RBC: 4.9 MIL/uL (ref 4.22–5.81)
RDW: 11.6 % (ref 11.5–15.5)
WBC: 6.9 10*3/uL (ref 4.0–10.5)
nRBC: 0 % (ref 0.0–0.2)
nRBC: 0 /100 WBC

## 2019-05-24 LAB — PROCALCITONIN: Procalcitonin: <0.1 ng/mL

## 2019-05-24 LAB — D-DIMER, QUANTITATIVE: D-Dimer, Quant: <0.27 ug/mL-FEU (ref 0.00–0.50)

## 2019-05-24 NOTE — Progress Notes (Signed)
PROGRESS NOTE                                                                                                                                                                                                             Patient Demographics:    Nicolas Willis, is a 54 y.o. male, DOB - 04-01-1965, JGO:[id number]  Outpatient Primary MD for the patient is Hadley Pen, MD    LOS - 3  Admit date - 05/21/2019    Chief Complaint  Patient presents with  . Shortness of Breath       Brief Narrative   Nicolas Willis is a 54 y.o. male with medical history significant for allergic rhinitis and asthma, now presenting to the emergency department with worsening shortness of breath, positive for Covid on 05/19/2019 and subsequently started getting more short of breath.  He was diagnosed with COVID-19 pneumonia admitted to the hospital.   Subjective:   Patient in bed, appears comfortable, denies any headache, no fever, no chest pain or pressure, improved shortness of breath, no abdominal pain. No focal weakness.   Assessment  & Plan :    1. Acute Hypoxic Resp. Failure due to Acute Covid 19 Viral Pneumonitis during the ongoing 2020 Covid 19 Pandemic - he has moderate disease and has been started on IV steroids and remdesivir, clinically improving, will advance activity and titrate down oxygen.  Complete IV remdesivir course and then discharged home.  Medically he is much improved and feels a whole lot better on 05/23/2019.  Encouraged the patient to sit up in chair in the daytime use I-S and flutter valve for pulmonary toiletry and then prone in bed when at night.    Recent Labs  Lab 05/21/19 2056 05/22/19 0004 05/23/19 0248 05/24/19 0349  CRP 3.3* 1.8* 0.9 0.7  DDIMER 0.72* 0.70* 0.52* <0.27  FERRITIN 563* 552*  --   --   BNP  --   --  47.8 45.8  PROCALCITON <0.10  --  <0.10 <0.10     2.  Asthma and rhinitis.  Supportive care.  No  wheezing.  3.  GERD.  PPI.   4.  Essential hypertension.  Stable on Norvasc.    Condition - FairExtremely Guarded  Family Communication  : Wife Victorino Dike over the phone on 05/23/2019  Code Status :  Full  Diet :   Diet Order            Diet Heart Room service appropriate? Yes; Fluid consistency: Thin  Diet effective now               Disposition Plan  :  Home after he finishes his IV remdesivir course for COVID-19 pneumonia  Consults  :  None  Procedures  :  None  PUD Prophylaxis : PPI  DVT Prophylaxis  :  Lovenox    Lab Results  Component Value Date   PLT 315 05/24/2019    Inpatient Medications  Scheduled Meds: . albuterol  2 puff Inhalation QID  . amLODipine  5 mg Oral QHS  . dexamethasone (DECADRON) injection  6 mg Intravenous Q24H  . enoxaparin (LOVENOX) injection  40 mg Subcutaneous QHS  . feeding supplement (ENSURE ENLIVE)  237 mL Oral Q24H  . fluticasone  1 spray Each Nare Daily  . influenza vac split quadrivalent PF  0.5 mL Intramuscular Tomorrow-1000  . mouth rinse  15 mL Mouth Rinse BID  . montelukast  10 mg Oral QHS  . pantoprazole  40 mg Oral QHS  . pneumococcal 23 valent vaccine  0.5 mL Intramuscular Tomorrow-1000   Continuous Infusions: . remdesivir 100 mg in NS 100 mL 100 mg (05/24/19 0900)   PRN Meds:.acetaminophen, HYDROcodone-acetaminophen, labetalol, loratadine, [DISCONTINUED] ondansetron **OR** ondansetron (ZOFRAN) IV, senna-docusate, sodium chloride  Antibiotics  :    Anti-infectives (From admission, onward)   Start     Dose/Rate Route Frequency Ordered Stop   05/22/19 1000  remdesivir 100 mg in sodium chloride 0.9 % 100 mL IVPB  Status:  Discontinued     100 mg 200 mL/hr over 30 Minutes Intravenous Daily 05/21/19 2225 05/21/19 2226   05/22/19 1000  remdesivir 100 mg in sodium chloride 0.9 % 100 mL IVPB     100 mg 200 mL/hr over 30 Minutes Intravenous Daily 05/21/19 2227 05/26/19 0959   05/21/19 2300  remdesivir 100 mg in sodium  chloride 0.9 % 100 mL IVPB     100 mg 200 mL/hr over 30 Minutes Intravenous Every 1 hr x 2 05/21/19 2227 05/22/19 0114   05/21/19 2230  remdesivir 200 mg in sodium chloride 0.9% 250 mL IVPB  Status:  Discontinued     200 mg 580 mL/hr over 30 Minutes Intravenous Once 05/21/19 2225 05/21/19 2226       Time Spent in minutes  30   Lala Lund M.D on 05/24/2019 at 1:47 PM  To page go to [email or web address] - password [facility]  [facility] -  Office  [phone]  See all Orders from today for further details    Objective:   Vitals:   05/23/19 1318 05/23/19 2108 05/24/19 0506 05/24/19 1211  BP: 130/89 (!) 136/99 120/87 (!) 130/97  Pulse: 76 80 70 70  Resp: 20 20 20 20   Temp: 97.7 F (36.5 C) 97.6 F (36.4 C) 97.7 F (36.5 C) 97.7 F (36.5 C)  TempSrc: Oral   Oral  SpO2: 95% 94% 95% 96%  Weight:      Height:        Wt Readings from Last 3 Encounters:  05/21/19 98 kg  08/16/18 97.9 kg  02/24/18 94.7 kg     Intake/Output Summary (Last 24 hours) at 05/24/2019 1347 Last data filed at 05/24/2019 1300 Gross per 24 hour  Intake 920 ml  Output --  Net 920 ml     Physical Exam  Awake Alert, No  new F.N deficits, Normal affect [place].AT,PERRAL Supple Neck,No JVD, No cervical lymphadenopathy appriciated.  Symmetrical Chest wall movement, Good air movement bilaterally, CTAB RRR,No Gallops, Rubs or new Murmurs, No Parasternal Heave +ve B.Sounds, Abd Soft, No tenderness, No organomegaly appriciated, No rebound - guarding or rigidity. No Cyanosis, Clubbing or edema, No new Rash or bruise    Data Review:    CBC Recent Labs  Lab 05/21/19 2056 05/22/19 0004 05/23/19 0248 05/24/19 0349  WBC 7.4 7.5 7.1 6.9  HGB 15.6 14.6 14.7 14.8  HCT 47.3 42.9 42.7 44.3  PLT 282 260 273 315  MCV 91.1 88.8 87.9 90.4  MCH 30.1 30.2 30.2 30.2  MCHC 33.0 34.0 34.4 33.4  RDW 11.6 11.5 11.3* 11.6  LYMPHSABS  --  0.4* 0.8 0.6*  MONOABS  --  0.2 0.4 0.1  EOSABS  --  0.0 0.0 0.0  BASOSABS   --  0.0 0.0 0.0    Chemistries  Recent Labs  Lab 05/21/19 2056 05/22/19 0004 05/23/19 0248 05/24/19 0349  NA 138 136 138 138  K 3.9 3.9 4.3 4.7  CL 103 105 103 104  CO2 23 20* 22 22  GLUCOSE 109* 127* 149* 144*  BUN 17 14 24* 25*  CREATININE 1.09 1.03 1.09 1.04  CALCIUM 8.7* 8.6* 9.0 9.0  MG  --  1.7  --   --   AST  --  46* 46* 40  ALT  --  57* 66* 66*  ALKPHOS  --  105 95 87  BILITOT  --  0.8 0.9 0.9   ------------------------------------------------------------------------------------------------------------------ Recent Labs    05/21/19 2120  TRIG 333*    No results found for: HGBA1C ------------------------------------------------------------------------------------------------------------------ No results for input(s): TSH, T4TOTAL, T3FREE, THYROIDAB in the last 72 hours.  Invalid input(s): FREET3  Cardiac Enzymes No results for input(s): CKMB, TROPONINI, MYOGLOBIN in the last 168 hours.  Invalid input(s): CK ------------------------------------------------------------------------------------------------------------------    Component Value Date/Time   BNP 45.8 05/24/2019 0349    Micro Results Recent Results (from the past 240 hour(s))  Blood Culture (routine x 2)     Status: None (Preliminary result)   Collection Time: 05/21/19  9:21 PM   Specimen: BLOOD  Result Value Ref Range Status   Specimen Description BLOOD RIGHT ANTECUBITAL  Final   Special Requests   Final    BOTTLES DRAWN AEROBIC AND ANAEROBIC Blood Culture adequate volume   Culture   Final    NO GROWTH 3 DAYS Performed at [facility], [address] [place], [place] [address]    Report Status PENDING  Incomplete  Blood Culture (routine x 2)     Status: None (Preliminary result)   Collection Time: 05/21/19  9:30 PM   Specimen: BLOOD LEFT FOREARM  Result Value Ref Range Status   Specimen Description BLOOD LEFT FOREARM  Final   Special Requests   Final    BOTTLES DRAWN AEROBIC  AND ANAEROBIC Blood Culture results may not be optimal due to an inadequate volume of blood received in culture bottles   Culture   Final    NO GROWTH 3 DAYS Performed at [facility], [address] [place], [place] [address]    Report Status PENDING  Incomplete    Radiology Reports DG Chest Portable 1 View  Result Date: 05/21/2019 CLINICAL DATA:  Chest pain, shortness of breath EXAM: PORTABLE CHEST 1 VIEW COMPARISON:  None FINDINGS: Multifocal patchy consolidative and mixed interstitial opacities with a basilar and peripheral predominance  as well as more focal opacity in the right upper lung. Lung volumes overall are low. Cardiac silhouette is within normal limits for portable technique. No acute osseous or soft tissue abnormality. Telemetry leads overlie the chest. IMPRESSION: Features compatible with a multifocal infectious process in the appropriate clinical setting. Electronically Signed   By: Kreg Shropshire M.D.   On: 05/21/2019 21:24

## 2019-05-25 LAB — CBC WITH DIFFERENTIAL/PLATELET
Abs Immature Granulocytes: 0 10*3/uL (ref 0.00–0.07)
Basophils Absolute: 0 10*3/uL (ref 0.0–0.1)
Basophils Relative: 0 %
Eosinophils Absolute: 0 10*3/uL (ref 0.0–0.5)
Eosinophils Relative: 0 %
HCT: 43.4 % (ref 39.0–52.0)
Hemoglobin: 14.7 g/dL (ref 13.0–17.0)
Lymphocytes Relative: 9 %
Lymphs Abs: 0.8 10*3/uL (ref 0.7–4.0)
MCH: 30.4 pg (ref 26.0–34.0)
MCHC: 33.9 g/dL (ref 30.0–36.0)
MCV: 89.7 fL (ref 80.0–100.0)
Monocytes Absolute: 0.5 10*3/uL (ref 0.1–1.0)
Monocytes Relative: 6 %
Neutro Abs: 7.6 10*3/uL (ref 1.7–7.7)
Neutrophils Relative %: 85 %
Platelets: 325 10*3/uL (ref 150–400)
RBC: 4.84 MIL/uL (ref 4.22–5.81)
RDW: 11.5 % (ref 11.5–15.5)
WBC: 8.9 10*3/uL (ref 4.0–10.5)
nRBC: 0 % (ref 0.0–0.2)
nRBC: 1 /100 WBC — ABNORMAL HIGH

## 2019-05-25 LAB — COMPREHENSIVE METABOLIC PANEL
ALT: 75 U/L — ABNORMAL HIGH (ref 0–44)
AST: 42 U/L — ABNORMAL HIGH (ref 15–41)
Albumin: 3.3 g/dL — ABNORMAL LOW (ref 3.5–5.0)
Alkaline Phosphatase: 89 U/L (ref 38–126)
Anion gap: 11 (ref 5–15)
BUN: 25 mg/dL — ABNORMAL HIGH (ref 6–20)
CO2: 21 mmol/L — ABNORMAL LOW (ref 22–32)
Calcium: 9.1 mg/dL (ref 8.9–10.3)
Chloride: 106 mmol/L (ref 98–111)
Creatinine, Ser: 1.05 mg/dL (ref 0.61–1.24)
GFR calc Af Amer: >60 mL/min (ref >60)
GFR calc non Af Amer: >60 mL/min (ref >60)
Glucose, Bld: 143 mg/dL — ABNORMAL HIGH (ref 70–99)
Potassium: 4.8 mmol/L (ref 3.5–5.1)
Sodium: 138 mmol/L (ref 135–145)
Total Bilirubin: 1.1 mg/dL (ref 0.3–1.2)
Total Protein: 6.7 g/dL (ref 6.5–8.1)

## 2019-05-25 LAB — D-DIMER, QUANTITATIVE: D-Dimer, Quant: 0.34 ug/mL-FEU (ref 0.00–0.50)

## 2019-05-25 LAB — BRAIN NATRIURETIC PEPTIDE: B Natriuretic Peptide: 34.6 pg/mL (ref 0.0–100.0)

## 2019-05-25 LAB — C-REACTIVE PROTEIN: CRP: 0.6 mg/dL (ref <1.0)

## 2019-05-25 MED ORDER — DEXAMETHASONE 6 MG PO TABS: 6.0000 mg | ORAL_TABLET | Freq: Every day | ORAL | 0 refills | Status: DC

## 2019-05-25 MED ORDER — ACETAMINOPHEN 325 MG PO TABS: 650.0000 mg | ORAL_TABLET | Freq: Four times a day (QID) | ORAL | Status: AC | PRN

## 2019-05-25 NOTE — Discharge Instructions (Signed)
Person Under Monitoring Name: Nicolas Willis  Location: [address] [place] [address]   Infection Prevention Recommendations for Individuals Confirmed to have, or Being Evaluated for, 2019 Novel Coronavirus (COVID-19) Infection Who Receive Care at Home  Individuals who are confirmed to have, or are being evaluated for, COVID-19 should follow the prevention steps below until a healthcare provider or local or state health department says they can return to normal activities.  Stay home except to get medical care You should restrict activities outside your home, except for getting medical care. Do not go to work, school, or public areas, and do not use public transportation or taxis.  Call ahead before visiting your doctor Before your medical appointment, call the healthcare provider and tell them that you have, or are being evaluated for, COVID-19 infection. This will help the healthcare provider's office take steps to keep other people from getting infected. Ask your healthcare provider to call the local or state health department.  Monitor your symptoms Seek prompt medical attention if your illness is worsening (e.g., difficulty breathing). Before going to your medical appointment, call the healthcare provider and tell them that you have, or are being evaluated for, COVID-19 infection. Ask your healthcare provider to call the local or state health department.  Wear a facemask You should wear a facemask that covers your nose and mouth when you are in the same room with other people and when you visit a healthcare provider. People who live with or visit you should also wear a facemask while they are in the same room with you.  Separate yourself from other people in your home As much as possible, you should stay in a different room from other people in your home. Also, you should use a separate bathroom, if available.  Avoid sharing household items You should not  share dishes, drinking glasses, cups, eating utensils, towels, bedding, or other items with other people in your home. After using these items, you should wash them thoroughly with soap and water.  Cover your coughs and sneezes Cover your mouth and nose with a tissue when you cough or sneeze, or you can cough or sneeze into your sleeve. Throw used tissues in a lined trash can, and immediately wash your hands with soap and water for at least 20 seconds or use an alcohol-based hand rub.  Wash your [employer] your hands often and thoroughly with soap and water for at least 20 seconds. You can use an alcohol-based hand sanitizer if soap and water are not available and if your hands are not visibly dirty. Avoid touching your eyes, nose, and mouth with unwashed hands.   Prevention Steps for Caregivers and Household Members of Individuals Confirmed to have, or Being Evaluated for, COVID-19 Infection Being Cared for in the Home  If you live with, or provide care at home for, a person confirmed to have, or being evaluated for, COVID-19 infection please follow these guidelines to prevent infection:  Follow healthcare provider's instructions Make sure that you understand and can help the patient follow any healthcare provider instructions for all care.  Provide for the patient's basic needs You should help the patient with basic needs in the home and provide support for getting groceries, prescriptions, and other personal needs.  Monitor the patient's symptoms If they are getting sicker, call his or her medical provider and tell them that the patient has, or is being evaluated for, COVID-19 infection. This will help the healthcare provider's office  take steps to keep other people from getting infected. Ask the healthcare provider to call the local or state health department.  Limit the number of people who have contact with the patient  If possible, have only one caregiver for the  patient.  Other household members should stay in another home or place of residence. If this is not possible, they should stay  in another room, or be separated from the patient as much as possible. Use a separate bathroom, if available.  Restrict visitors who do not have an essential need to be in the home.  Keep older adults, very young children, and other sick people away from the patient Keep older adults, very young children, and those who have compromised immune systems or chronic health conditions away from the patient. This includes people with chronic heart, lung, or kidney conditions, diabetes, and cancer.  Ensure good ventilation Make sure that shared spaces in the home have good air flow, such as from an air conditioner or an opened window, weather permitting.  Wash your hands often  Wash your hands often and thoroughly with soap and water for at least 20 seconds. You can use an alcohol based hand sanitizer if soap and water are not available and if your hands are not visibly dirty.  Avoid touching your eyes, nose, and mouth with unwashed hands.  Use disposable paper towels to dry your hands. If not available, use dedicated cloth towels and replace them when they become wet.  Wear a facemask and gloves  Wear a disposable facemask at all times in the room and gloves when you touch or have contact with the patient's blood, body fluids, and/or secretions or excretions, such as sweat, saliva, sputum, nasal mucus, vomit, urine, or feces.  Ensure the mask fits over your nose and mouth tightly, and do not touch it during use.  Throw out disposable facemasks and gloves after using them. Do not reuse.  Wash your hands immediately after removing your facemask and gloves.  If your personal clothing becomes contaminated, carefully remove clothing and launder. Wash your hands after handling contaminated clothing.  Place all used disposable facemasks, gloves, and other waste in a lined  container before disposing them with other household waste.  Remove gloves and wash your hands immediately after handling these items.  Do not share dishes, glasses, or other household items with the patient  Avoid sharing household items. You should not share dishes, drinking glasses, cups, eating utensils, towels, bedding, or other items with a patient who is confirmed to have, or being evaluated for, COVID-19 infection.  After the person uses these items, you should wash them thoroughly with soap and water.  Wash laundry thoroughly  Immediately remove and wash clothes or bedding that have blood, body fluids, and/or secretions or excretions, such as sweat, saliva, sputum, nasal mucus, vomit, urine, or feces, on them.  Wear gloves when handling laundry from the patient.  Read and follow directions on labels of laundry or clothing items and detergent. In general, wash and dry with the warmest temperatures recommended on the label.  Clean all areas the individual has used often  Clean all touchable surfaces, such as counters, tabletops, doorknobs, bathroom fixtures, toilets, phones, keyboards, tablets, and bedside tables, every day. Also, clean any surfaces that may have blood, body fluids, and/or secretions or excretions on them.  Wear gloves when cleaning surfaces the patient has come in contact with.  Use a diluted bleach solution (e.g., dilute bleach with 1 part  bleach and 10 parts water) or a household disinfectant with a label that says EPA-registered for coronaviruses. To make a bleach solution at home, add 1 tablespoon of bleach to 1 quart (4 cups) of water. For a larger supply, add  cup of bleach to 1 gallon (16 cups) of water.  Read labels of cleaning products and follow recommendations provided on product labels. Labels contain instructions for safe and effective use of the cleaning product including precautions you should take when applying the product, such as wearing gloves or  eye protection and making sure you have good ventilation during use of the product.  Remove gloves and wash hands immediately after cleaning.  Monitor yourself for signs and symptoms of illness Caregivers and household members are considered close contacts, should monitor their health, and will be asked to limit movement outside of the home to the extent possible. Follow the monitoring steps for close contacts listed on the symptom monitoring form.   ? If you have additional questions, contact your local health department or call the epidemiologist on call at [phone] (available 24/7). ? This guidance is subject to change. For the most up-to-date guidance from [facility], please refer to their website: [email or web address].pl

## 2019-05-25 NOTE — Progress Notes (Signed)
Pt given discharge instructions, prescriptions, and care notes. Pt verbalized understanding AEB no further questions or concerns at this time. IV was discontinued, no redness, pain, or swelling noted at this time. Pt left the floor via wheelchair with staff in stable condition. 

## 2019-05-25 NOTE — Discharge Summary (Signed)
Nicolas Willis, is a 54 y.o. male  DOB 05/10/1965  MRN [id number].  Admission date:  05/21/2019  Admitting Physician  Briscoe Deutscher, MD  Discharge Date:  05/25/2019   Primary MD  Hadley Pen, MD  Recommendations for primary care physician for things to follow:  - please check CBC, CMP during next visit.    Admission Diagnosis  Hypoxia [R09.02] COVID-19 virus infection [U07.1] Acute respiratory disease due to COVID-19 virus [U07.1, J06.9]   Discharge Diagnosis  Hypoxia [R09.02] COVID-19 virus infection [U07.1] Acute respiratory disease due to COVID-19 virus [U07.1, J06.9]   Principal Problem:   Acute respiratory disease due to COVID-19 virus Active Problems:   Asthma   Hypertensive urgency      Past Medical History:  Diagnosis Date  . Asthma   . Frequent sinus infections     Past Surgical History:  Procedure Laterality Date  . SINOSCOPY         History of present illness and  Hospital Course:     Kindly see H&P for history of present illness and admission details, please review complete Labs, Consult reports and Test reports for all details in brief  HPI  from the history and physical done on the day of admission 05/21/2019 HPI: Nicolas Willis is a 54 y.o. male with medical history significant for allergic rhinitis and asthma, now presenting to the emergency department with worsening shortness of breath.  Patient reports that he had been in his usual state of health until 8 days ago when he developed a fever and generalized body aches.  Over the ensuing days, he developed sinus congestion, cough, and shortness of breath.  Over the past 24 hours, shortness of breath has worsened significantly.  He had seen his allergist in the clinic on 05/18/2019 and and was given a steroid injection at that time.  He was also tested for COVID-19 on 05/19/2019 that was positive.  Patient denies any leg  swelling or tenderness but has had some pleuritic chest pain over the past day or so.  Denies hemoptysis.  Denies any significant GI symptoms or rash.  He had been checking his oxygen saturations at home and noted them to be in the upper 80s this evening.  ED Course: Upon arrival to the ED, patient is found to be afebrile, saturating mid to upper 80s on room air, tachypneic, and hypertensive.  EKG features a sinus rhythm with nonspecific ST-T abnormality.  Chest x-ray is concerning for multifocal pneumonia.  Chemistry panel and CBC are unremarkable, lactic acid level and high-sensitivity troponin are normal, and inflammatory markers have not yet resulted.  The patient was started on supplemental oxygen and treated with Norvasc and Decadron in the ED.   Hospital Course   Nicolas Willis a 54 y.o.malewith medical history significant forallergic rhinitis and asthma, now presenting to the emergency department with worsening shortness of breath, positive for Covid on 05/19/2019 and subsequently started getting more short of breath.  He was diagnosed with COVID-19 pneumonia admitted to the hospital.  Acute Hypoxic Resp. Failure due to Acute Covid 19 Viral Pneumonitis during the ongoing 2020 Covid 19 Pandemic - he has moderate disease and has been started on IV steroids and remdesivir, clinically improving, he is with no oxygen requirement for last 72 hours, he will be discharged another 5 days of Decadron to finish 10 days of treatment, encouraged to take incentive spirometry and flutter valve with him to keep using it at home .Marland Kitchen   Asthma and rhinitis.  Supportive care.  No wheezing.  GERD.  PPI.   Essential hypertension.  Stable on Norvasc.  Discharge Condition:  Stable   Follow UP  Follow-up Information    Hadley Pen, MD Follow up in 2 week(s).   Specialty: Family Medicine Contact information: [address] [place] [address] [phone]             Discharge  Instructions  and  Discharge Medications    Discharge Instructions    Diet - low sodium heart healthy   Complete by: As directed    Discharge instructions   Complete by: As directed    Follow with Primary MD Hadley Pen, MD in 14 days   Get CBC, CMP, 2 view Chest X ray checked  by Primary MD next visit.    Activity: As tolerated with Full fall precautions use walker/cane & assistance as needed   Disposition Home    Diet: Heart Healthy   On your next visit with your primary care physician please Get Medicines reviewed and adjusted.   Please request your Prim.MD to go over all Hospital Tests and Procedure/Radiological results at the follow up, please get all Hospital records sent to your Prim MD by signing hospital release before you go home.   If you experience worsening of your admission symptoms, develop shortness of breath, life threatening emergency, suicidal or homicidal thoughts you must seek medical attention immediately by calling 911 or calling your MD immediately  if symptoms less severe.  You Must read complete instructions/literature along with all the possible adverse reactions/side effects for all the Medicines you take and that have been prescribed to you. Take any new Medicines after you have completely understood and accpet all the possible adverse reactions/side effects.   Do not drive, operating heavy machinery, perform activities at heights, swimming or participation in water activities or provide baby sitting services if your were admitted for syncope or siezures until you have seen by Primary MD or a Neurologist and advised to do so again.  Do not drive when taking Pain medications.    Do not take more than prescribed Pain, Sleep and Anxiety Medications  Special Instructions: If you have smoked or chewed Tobacco  in the last 2 yrs please stop smoking, stop any regular Alcohol  and or any Recreational drug use.  Wear Seat belts while  driving.   Please note  You were cared for by a hospitalist during your hospital stay. If you have any questions about your discharge medications or the care you received while you were in the hospital after you are discharged, you can call the unit and asked to speak with the hospitalist on call if the hospitalist that took care of you is not available. Once you are discharged, your primary care physician will handle any further medical issues. Please note that NO REFILLS for any discharge medications will be authorized once you are discharged, as it is imperative that you return to your primary care physician (or establish a  relationship with a primary care physician if you do not have one) for your aftercare needs so that they can reassess your need for medications and monitor your lab values.   Increase activity slowly   Complete by: As directed    MyChart COVID-19 home monitoring program   Complete by: May 25, 2019    Is the patient willing to use the MyChart Mobile App for home monitoring?: Yes   Temperature monitoring   Complete by: May 25, 2019    After how many days would you like to receive a notification of this patient's flowsheet entries?: 1     Allergies as of 05/25/2019   No Known Allergies     Medication List    TAKE these medications   acetaminophen 325 MG tablet Commonly known as: TYLENOL Take 2 tablets (650 mg total) by mouth every 6 (six) hours as needed for mild pain or headache (fever >/= 101).   albuterol 108 (90 Base) MCG/ACT inhaler Commonly known as: Proventil HFA Inhale two puffs every four to six hours as needed for cough or wheeze. What changed:   how much to take  how to take this  when to take this  reasons to take this  additional instructions   amLODipine 5 MG tablet Commonly known as: NORVASC Take 5 mg by mouth at bedtime.   dexamethasone 6 MG tablet Commonly known as: DECADRON Take 1 tablet (6 mg total) by mouth daily. Start taking  on: May 26, 2019   EPINEPHrine 0.3 mg/0.3 mL Soaj injection Commonly known as: EpiPen 2-Pak Use as directed for life threatening allergic reactions What changed:   how much to take  how to take this  when to take this  reasons to take this  additional instructions   loratadine 10 MG tablet Commonly known as: CLARITIN Take 10 mg by mouth daily as needed for allergies.   mometasone 50 MCG/ACT nasal spray Commonly known as: Nasonex Use one spray in each nostril once or twice daily What changed:   how much to take  how to take this  when to take this  additional instructions   montelukast 10 MG tablet Commonly known as: SINGULAIR Take one tablet once daily as directed What changed:   how much to take  how to take this  when to take this  additional instructions   Nucala 100 MG Solr Generic drug: Mepolizumab INJECT 100MG  SUBCUTANEOUSLY EVERY 4 WEEKS. What changed: See the new instructions.   pantoprazole 40 MG tablet Commonly known as: PROTONIX Take 40 mg by mouth at bedtime.         Diet and Activity recommendation: See Discharge Instructions above   Consults obtained -  None   Major procedures and Radiology Reports - PLEASE review detailed and final reports for all details, in brief -     DG Chest Portable 1 View  Result Date: 05/21/2019 CLINICAL DATA:  Chest pain, shortness of breath EXAM: PORTABLE CHEST 1 VIEW COMPARISON:  None FINDINGS: Multifocal patchy consolidative and mixed interstitial opacities with a basilar and peripheral predominance as well as more focal opacity in the right upper lung. Lung volumes overall are low. Cardiac silhouette is within normal limits for portable technique. No acute osseous or soft tissue abnormality. Telemetry leads overlie the chest. IMPRESSION: Features compatible with a multifocal infectious process in the appropriate clinical setting. Electronically Signed   By: 07/21/2019 M.D.   On: 05/21/2019 21:24     Micro Results  Recent Results (from the past 240 hour(s))  Blood Culture (routine x 2)     Status: None (Preliminary result)   Collection Time: 05/21/19  9:21 PM   Specimen: BLOOD  Result Value Ref Range Status   Specimen Description BLOOD RIGHT ANTECUBITAL  Final   Special Requests   Final    BOTTLES DRAWN AEROBIC AND ANAEROBIC Blood Culture adequate volume   Culture   Final    NO GROWTH 4 DAYS Performed at [facility], [address], [place], [place] [address]    Report Status PENDING  Incomplete  Blood Culture (routine x 2)     Status: None (Preliminary result)   Collection Time: 05/21/19  9:30 PM   Specimen: BLOOD LEFT FOREARM  Result Value Ref Range Status   Specimen Description BLOOD LEFT FOREARM  Final   Special Requests   Final    BOTTLES DRAWN AEROBIC AND ANAEROBIC Blood Culture results may not be optimal due to an inadequate volume of blood received in culture bottles   Culture   Final    NO GROWTH 4 DAYS Performed at [facility], [place] [address] [place], [place] [address]    Report Status PENDING  Incomplete       Today   Subjective:   Nicolas Willis today has no headache,no chest abdominal pain,no new weakness tingling or numbness, feels much better wants to go home today.   Objective:   Blood pressure 111/79, pulse 79, temperature 97.7 F (36.5 C), temperature source Oral, resp. rate 20, height 5\' 10"  (1.778 m), weight 98 kg, SpO2 95 %.   Intake/Output Summary (Last 24 hours) at 05/25/2019 1106 Last data filed at 05/25/2019 0857 Gross per 24 hour  Intake 720 ml  Output --  Net 720 ml    Exam Awake Alert, Oriented x 3, No new F.N deficits, Normal affect Symmetrical Chest wall movement, Good air movement bilaterally, CTAB RRR,No Gallops,Rubs or new Murmurs, No Parasternal Heave Abd Soft,  No edema.  Data Review   CBC w Diff:  Lab Results  Component Value Date   WBC 8.9 05/25/2019   HGB 14.7 05/25/2019   HGB 16.5  03/05/2018   HCT 43.4 05/25/2019   HCT 46.9 03/05/2018   PLT 325 05/25/2019   PLT 323 03/05/2018   LYMPHOPCT 9 05/25/2019   MONOPCT 6 05/25/2019   EOSPCT 0 05/25/2019   BASOPCT 0 05/25/2019    CMP:  Lab Results  Component Value Date   NA 138 05/25/2019   K 4.8 05/25/2019   CL 106 05/25/2019   CO2 21 (L) 05/25/2019   BUN 25 (H) 05/25/2019   CREATININE 1.05 05/25/2019   PROT 6.7 05/25/2019   ALBUMIN 3.3 (L) 05/25/2019   BILITOT 1.1 05/25/2019   ALKPHOS 89 05/25/2019   AST 42 (H) 05/25/2019   ALT 75 (H) 05/25/2019  .   Total Time in preparing paper work, data evaluation and todays exam - 63 minutes  Phillips Climes M.D on 05/25/2019 at 11:06 AM  [facility]   Office  [phone]

## 2019-05-25 NOTE — Care Management (Signed)
Pt discharged prior to CM assessment. CM was able to contact pt via phone - pt confirms he is independent from home with spouse.  Pt confirms he has a PCP and denied barriers with paying for medications.  No outstanding TOC orders or needs -  CM signing off

## 2019-05-26 ENCOUNTER — Encounter: Payer: Self-pay | Admitting: Allergy and Immunology

## 2019-05-26 LAB — CULTURE, BLOOD (ROUTINE X 2)
Culture: NO GROWTH
Culture: NO GROWTH
Special Requests: ADEQUATE

## 2019-06-01 ENCOUNTER — Ambulatory Visit (INDEPENDENT_AMBULATORY_CARE_PROVIDER_SITE_OTHER): Payer: Managed Care, Other (non HMO) | Admitting: *Deleted

## 2019-06-01 ENCOUNTER — Other Ambulatory Visit: Payer: Self-pay

## 2019-06-01 DIAGNOSIS — J4550 Severe persistent asthma, uncomplicated: Secondary | ICD-10-CM

## 2019-06-29 ENCOUNTER — Other Ambulatory Visit: Payer: Self-pay

## 2019-06-29 ENCOUNTER — Ambulatory Visit (INDEPENDENT_AMBULATORY_CARE_PROVIDER_SITE_OTHER): Payer: Managed Care, Other (non HMO) | Admitting: *Deleted

## 2019-06-29 DIAGNOSIS — J4550 Severe persistent asthma, uncomplicated: Secondary | ICD-10-CM | POA: Diagnosis not present

## 2019-07-27 ENCOUNTER — Ambulatory Visit (INDEPENDENT_AMBULATORY_CARE_PROVIDER_SITE_OTHER): Payer: Managed Care, Other (non HMO) | Admitting: *Deleted

## 2019-07-27 ENCOUNTER — Other Ambulatory Visit: Payer: Self-pay

## 2019-07-27 DIAGNOSIS — J4550 Severe persistent asthma, uncomplicated: Secondary | ICD-10-CM

## 2019-08-24 ENCOUNTER — Other Ambulatory Visit: Payer: Self-pay

## 2019-08-24 ENCOUNTER — Ambulatory Visit (INDEPENDENT_AMBULATORY_CARE_PROVIDER_SITE_OTHER): Payer: Managed Care, Other (non HMO) | Admitting: *Deleted

## 2019-08-24 DIAGNOSIS — J4550 Severe persistent asthma, uncomplicated: Secondary | ICD-10-CM

## 2019-09-17 ENCOUNTER — Other Ambulatory Visit: Payer: Self-pay | Admitting: Allergy and Immunology

## 2019-09-21 ENCOUNTER — Ambulatory Visit (INDEPENDENT_AMBULATORY_CARE_PROVIDER_SITE_OTHER): Payer: Managed Care, Other (non HMO) | Admitting: *Deleted

## 2019-09-21 ENCOUNTER — Other Ambulatory Visit: Payer: Self-pay

## 2019-09-21 DIAGNOSIS — J4550 Severe persistent asthma, uncomplicated: Secondary | ICD-10-CM

## 2019-10-19 ENCOUNTER — Ambulatory Visit (INDEPENDENT_AMBULATORY_CARE_PROVIDER_SITE_OTHER): Payer: Managed Care, Other (non HMO) | Admitting: *Deleted

## 2019-10-19 ENCOUNTER — Other Ambulatory Visit: Payer: Self-pay

## 2019-10-19 DIAGNOSIS — J4550 Severe persistent asthma, uncomplicated: Secondary | ICD-10-CM | POA: Diagnosis not present

## 2019-11-16 ENCOUNTER — Ambulatory Visit (INDEPENDENT_AMBULATORY_CARE_PROVIDER_SITE_OTHER): Payer: Managed Care, Other (non HMO) | Admitting: *Deleted

## 2019-11-16 ENCOUNTER — Other Ambulatory Visit: Payer: Self-pay

## 2019-11-16 DIAGNOSIS — J4550 Severe persistent asthma, uncomplicated: Secondary | ICD-10-CM

## 2019-12-14 ENCOUNTER — Other Ambulatory Visit: Payer: Self-pay

## 2019-12-14 ENCOUNTER — Ambulatory Visit (INDEPENDENT_AMBULATORY_CARE_PROVIDER_SITE_OTHER): Payer: Managed Care, Other (non HMO)

## 2019-12-14 DIAGNOSIS — J4550 Severe persistent asthma, uncomplicated: Secondary | ICD-10-CM

## 2019-12-24 ENCOUNTER — Other Ambulatory Visit: Payer: Self-pay | Admitting: Allergy and Immunology

## 2020-01-11 ENCOUNTER — Ambulatory Visit (INDEPENDENT_AMBULATORY_CARE_PROVIDER_SITE_OTHER): Payer: Managed Care, Other (non HMO)

## 2020-01-11 ENCOUNTER — Other Ambulatory Visit: Payer: Self-pay

## 2020-01-11 DIAGNOSIS — J4550 Severe persistent asthma, uncomplicated: Secondary | ICD-10-CM | POA: Diagnosis not present

## 2020-02-08 ENCOUNTER — Ambulatory Visit (INDEPENDENT_AMBULATORY_CARE_PROVIDER_SITE_OTHER): Payer: Managed Care, Other (non HMO) | Admitting: *Deleted

## 2020-02-08 ENCOUNTER — Other Ambulatory Visit: Payer: Self-pay

## 2020-02-08 DIAGNOSIS — J4550 Severe persistent asthma, uncomplicated: Secondary | ICD-10-CM | POA: Diagnosis not present

## 2020-03-07 ENCOUNTER — Other Ambulatory Visit: Payer: Self-pay

## 2020-03-07 ENCOUNTER — Ambulatory Visit (INDEPENDENT_AMBULATORY_CARE_PROVIDER_SITE_OTHER): Payer: Managed Care, Other (non HMO) | Admitting: *Deleted

## 2020-03-07 DIAGNOSIS — J4550 Severe persistent asthma, uncomplicated: Secondary | ICD-10-CM | POA: Diagnosis not present

## 2020-03-12 ENCOUNTER — Other Ambulatory Visit: Payer: Self-pay | Admitting: Allergy and Immunology

## 2020-03-22 ENCOUNTER — Ambulatory Visit: Payer: Managed Care, Other (non HMO) | Admitting: Allergy and Immunology

## 2020-03-31 ENCOUNTER — Other Ambulatory Visit: Payer: Self-pay | Admitting: Allergy and Immunology

## 2020-04-04 ENCOUNTER — Ambulatory Visit (INDEPENDENT_AMBULATORY_CARE_PROVIDER_SITE_OTHER): Payer: Managed Care, Other (non HMO)

## 2020-04-04 ENCOUNTER — Other Ambulatory Visit: Payer: Self-pay

## 2020-04-04 DIAGNOSIS — J4550 Severe persistent asthma, uncomplicated: Secondary | ICD-10-CM

## 2020-05-02 ENCOUNTER — Ambulatory Visit (INDEPENDENT_AMBULATORY_CARE_PROVIDER_SITE_OTHER): Payer: Managed Care, Other (non HMO)

## 2020-05-02 DIAGNOSIS — J4550 Severe persistent asthma, uncomplicated: Secondary | ICD-10-CM

## 2020-05-30 ENCOUNTER — Other Ambulatory Visit: Payer: Self-pay

## 2020-05-30 ENCOUNTER — Ambulatory Visit (INDEPENDENT_AMBULATORY_CARE_PROVIDER_SITE_OTHER): Payer: Managed Care, Other (non HMO) | Admitting: Allergy and Immunology

## 2020-05-30 ENCOUNTER — Ambulatory Visit: Payer: Self-pay

## 2020-05-30 ENCOUNTER — Encounter: Payer: Self-pay | Admitting: Allergy and Immunology

## 2020-05-30 VITALS — BP 130/96 | HR 80 | Resp 18

## 2020-05-30 DIAGNOSIS — J4550 Severe persistent asthma, uncomplicated: Secondary | ICD-10-CM

## 2020-05-30 DIAGNOSIS — J3089 Other allergic rhinitis: Secondary | ICD-10-CM | POA: Diagnosis not present

## 2020-05-30 DIAGNOSIS — J339 Nasal polyp, unspecified: Secondary | ICD-10-CM | POA: Diagnosis not present

## 2020-05-30 MED ORDER — ALBUTEROL SULFATE HFA 108 (90 BASE) MCG/ACT IN AERS: INHALATION_SPRAY | RESPIRATORY_TRACT | 1 refills | Status: DC

## 2020-05-30 MED ORDER — METHYLPREDNISOLONE ACETATE 80 MG/ML IJ SUSP
80.0000 mg | Freq: Once | INTRAMUSCULAR | Status: AC
  Administered 2020-05-30: 80 mg via INTRAMUSCULAR

## 2020-05-30 NOTE — Patient Instructions (Signed)
    1. Continue to perform Allergen avoidance measures as best as possible  2. Continue to Treat and prevent inflammation:   A. Nasonex one spray each nostril Daily  B. montelukast 10 mg one tablet Daily  C. Mepolizumab injections  3. If needed:   A. Proventil HFA 2 puffs every 4-6 hours  B. loratadine 10 mg one tablet one time per day  C. nasal saline wash  4.  For this recent episode:   A.  Depo-Medrol 80 IM delivered in clinic  5. Return to clinic in 12 months

## 2020-05-30 NOTE — Progress Notes (Signed)
[place] - [place] - [place] - [place] - [place]   Follow-up Note  Referring Provider: Hadley Pen, MD Primary Provider: Hadley Pen, MD Date of Office Visit: 05/30/2020  Subjective:   Nicolas Willis (DOB: August 30, 1965) is a 55 y.o. male who [facility] in re-evaluation of the following:  HPI: Nicolas Willis presents to this clinic in evaluation of eosinophilic driven airway disease including severe persistent asthma treated with benralizumab and allergic rhinitis and nasal polyposis.  His last visit to this clinic was 18 May 2019.  He has really done well with his multiorgan inflammatory disease affecting his upper and lower airway until the past week or so.  Since the pollen has arrived he has developed more nasal congestion and he has had some wheezing and some crackling in his chest.  Prior to this point in time he did not require systemic steroid or an antibiotic and rarely uses a short acting bronchodilator and could exercise without any problem.  Benralizumab injections has resulted in almost complete control of his multiorgan atopic disease except during spring pollination season.  He always develops a little bit of problem with both his upper and lower airway during this time of the year.  He did contract COVID in February 2021 requiring hospitalization and treatment with remdesivir successfully.  He has not received any Covid vaccines.  Allergies as of 05/30/2020   No Known Allergies     Medication List      acetaminophen 325 MG tablet Commonly known as: TYLENOL Take 2 tablets (650 mg total) by mouth every 6 (six) hours as needed for mild pain or headache (fever >/= 101).   albuterol 108 (90 Base) MCG/ACT inhaler Commonly known as: Proventil HFA Inhale two puffs every four to six hours as needed for cough or wheeze   amLODipine 5 MG tablet Commonly known as: NORVASC Take 5 mg by mouth at bedtime.    dexamethasone 6 MG tablet Commonly known as: DECADRON Take 1 tablet (6 mg total) by mouth daily.   EPINEPHrine 0.3 mg/0.3 mL Soaj injection Commonly known as: EpiPen 2-Pak Use as directed for life threatening allergic reactions   fexofenadine 180 MG tablet Commonly known as: ALLEGRA Take 180 mg by mouth daily.   loratadine 10 MG tablet Commonly known as: CLARITIN Take 10 mg by mouth daily as needed for allergies.   mometasone 50 MCG/ACT nasal spray Commonly known as: Nasonex Use one spray in each nostril once or twice dail   montelukast 10 MG tablet Commonly known as: SINGULAIR Take one tablet once daily as directed   Nucala 100 MG Solr Generic drug: Mepolizumab INJECT 100MG  SUBCUTANEOUSLY EVERY 4 WEEKS.   pantoprazole 40 MG tablet Commonly known as: PROTONIX Take 40 mg by mouth at bedtime.   sterile water (preservative free) injection USE AS DIRECTED       Past Medical History:  Diagnosis Date   Asthma    Frequent sinus infections     Past Surgical History:  Procedure Laterality Date   SINOSCOPY      Review of systems negative except as noted in HPI / PMHx or noted below:  Review of Systems  Constitutional: Negative.   HENT: Negative.   Eyes: Negative.   Respiratory: Negative.   Cardiovascular: Negative.   Gastrointestinal: Negative.   Genitourinary: Negative.   Musculoskeletal: Negative.   Skin: Negative.   Neurological: Negative.   Endo/Heme/Allergies: Negative.   Psychiatric/Behavioral: Negative.  Objective:   Vitals:   05/30/20 1010  BP: (!) 130/96  Pulse: 80  Resp: 18  SpO2: 96%          Physical Exam Constitutional:      Appearance: He is not diaphoretic.  HENT:     Head: Normocephalic.     Right Ear: Tympanic membrane, ear canal and external ear normal.     Left Ear: Tympanic membrane, ear canal and external ear normal.     Nose: Mucosal edema present. No rhinorrhea.     Mouth/Throat:     Pharynx: Uvula midline.  No oropharyngeal exudate.  Eyes:     Conjunctiva/sclera: Conjunctivae normal.  Neck:     Thyroid: No thyromegaly.     Trachea: Trachea normal. No tracheal tenderness or tracheal deviation.  Cardiovascular:     Rate and Rhythm: Normal rate and regular rhythm.     Heart sounds: Normal heart sounds, S1 normal and S2 normal. No murmur heard.   Pulmonary:     Effort: No respiratory distress.     Breath sounds: Normal breath sounds. No stridor. No wheezing (Bilateral expiratory wheezes posterior lung fields) or rales.  Lymphadenopathy:     Head:     Right side of head: No tonsillar adenopathy.     Left side of head: No tonsillar adenopathy.     Cervical: No cervical adenopathy.  Skin:    Findings: No erythema or rash.     Nails: There is no clubbing.  Neurological:     Mental Status: He is alert.     Diagnostics:    Spirometry was performed and demonstrated an FEV1 of 3.67 at 95 % of predicted.   Assessment and Plan:   1. Not well controlled severe persistent asthma   2. Other allergic rhinitis   3. Nasal polyposis      1. Continue to perform Allergen avoidance measures as best as possible  2. Continue to Treat and prevent inflammation:   A. Nasonex one spray each nostril Daily  B. montelukast 10 mg one tablet Daily  C. Mepolizumab injections  3. If needed:   A. Proventil HFA 2 puffs every 4-6 hours  B. loratadine 10 mg one tablet one time per day  C. nasal saline wash  4.  For this recent episode:   A.  Depo-Medrol 80 IM delivered in clinic  5. Return to clinic in 12 months  Nicolas Willis has some inflammation of his airway secondary to pollen exposure during the spring.  Last spring we gave him a systemic steroid to use and this resulted in dramatic improvement throughout the entire spring and we will once again give him a 80 mg Depo-Medrol injection today and this should result in very good control of his condition as he goes through the spring.  He will continue to  utilize a combination of anti-inflammatory agents for his airway including mepolizumab.  I will see him back in his clinic in 1 year or earlier if there is a problem.  Laurette Schimke, MD Allergy / Immunology [facility]

## 2020-05-31 ENCOUNTER — Encounter: Payer: Self-pay | Admitting: Allergy and Immunology

## 2020-06-27 ENCOUNTER — Other Ambulatory Visit: Payer: Self-pay

## 2020-06-27 ENCOUNTER — Ambulatory Visit (INDEPENDENT_AMBULATORY_CARE_PROVIDER_SITE_OTHER): Payer: Managed Care, Other (non HMO) | Admitting: *Deleted

## 2020-06-27 DIAGNOSIS — J4550 Severe persistent asthma, uncomplicated: Secondary | ICD-10-CM | POA: Diagnosis not present

## 2020-07-25 ENCOUNTER — Ambulatory Visit (INDEPENDENT_AMBULATORY_CARE_PROVIDER_SITE_OTHER): Payer: Managed Care, Other (non HMO) | Admitting: *Deleted

## 2020-07-25 ENCOUNTER — Other Ambulatory Visit: Payer: Self-pay

## 2020-07-25 DIAGNOSIS — J4550 Severe persistent asthma, uncomplicated: Secondary | ICD-10-CM

## 2020-08-22 ENCOUNTER — Ambulatory Visit: Payer: Managed Care, Other (non HMO)

## 2020-08-27 ENCOUNTER — Ambulatory Visit (INDEPENDENT_AMBULATORY_CARE_PROVIDER_SITE_OTHER): Payer: Managed Care, Other (non HMO) | Admitting: *Deleted

## 2020-08-27 DIAGNOSIS — J4550 Severe persistent asthma, uncomplicated: Secondary | ICD-10-CM

## 2020-09-11 ENCOUNTER — Telehealth: Payer: Managed Care, Other (non HMO) | Admitting: Physician Assistant

## 2020-09-11 DIAGNOSIS — U071 COVID-19: Secondary | ICD-10-CM

## 2020-09-11 MED ORDER — MOLNUPIRAVIR EUA 200MG CAPSULE: 4.0000 | ORAL_CAPSULE | Freq: Two times a day (BID) | ORAL | 0 refills | Status: AC

## 2020-09-11 NOTE — Patient Instructions (Addendum)
Nicolas Willis, thank you for joining Piedad Climes, PA-C for today's virtual visit.  While this provider is not your primary care provider (PCP), if your PCP is located in our provider database this encounter information will be shared with them immediately following your visit.  Consent: (Patient) Nicolas Willis provided verbal consent for this virtual visit at the beginning of the encounter.  Current Medications:  Current Outpatient Medications:    acetaminophen (TYLENOL) 325 MG tablet, Take 2 tablets (650 mg total) by mouth every 6 (six) hours as needed for mild pain or headache (fever >/= 101)., Disp:  , Rfl:    albuterol (PROVENTIL HFA) 108 (90 Base) MCG/ACT inhaler, Inhale two puffs every four to six hours as needed for cough or wheeze., Disp: 18 g, Rfl: 1   amLODipine (NORVASC) 5 MG tablet, Take 5 mg by mouth at bedtime. , Disp: , Rfl:    dexamethasone (DECADRON) 6 MG tablet, Take 1 tablet (6 mg total) by mouth daily., Disp: 5 tablet, Rfl: 0   EPINEPHrine (EPIPEN 2-PAK) 0.3 mg/0.3 mL IJ SOAJ injection, Use as directed for life threatening allergic reactions (Patient taking differently: Inject 0.3 mg into the muscle as needed for anaphylaxis.), Disp: 2 Device, Rfl: 3   fexofenadine (ALLEGRA) 180 MG tablet, Take 180 mg by mouth daily., Disp: , Rfl:    loratadine (CLARITIN) 10 MG tablet, Take 10 mg by mouth daily as needed for allergies. , Disp: , Rfl:    mometasone (NASONEX) 50 MCG/ACT nasal spray, Use one spray in each nostril once or twice daily (Patient taking differently: Place 1 spray into the nose at bedtime.), Disp: 17 g, Rfl: 11   montelukast (SINGULAIR) 10 MG tablet, Take one tablet once daily as directed, Disp: 30 tablet, Rfl: 2   NUCALA 100 MG SOLR, INJECT 100MG  SUBCUTANEOUSLY EVERY 4 WEEKS., Disp: 1 each, Rfl: 10   pantoprazole (PROTONIX) 40 MG tablet, Take 40 mg by mouth at bedtime. , Disp: , Rfl:    Water For Injection Sterile (STERILE WATER, PRESERVATIVE FREE,)  injection, USE AS DIRECTED, Disp: 1000 mL, Rfl: 10  Current Facility-Administered Medications:    Mepolizumab SOLR 100 mg, 100 mg, Subcutaneous, Q28 days, Kozlow, , MD, 100 mg at 08/27/20 1732   Medications ordered in this encounter:  No orders of the defined types were placed in this encounter.    *If you need refills on other medications prior to your next appointment, please contact your pharmacy*  Follow-Up: Call back or seek an in-person evaluation if the symptoms worsen or if the condition fails to improve as anticipated.  Other Instructions Please keep well-hydrated and get plenty of rest. Start a saline nasal rinse to flush out your nasal passages. You can use plain Mucinex to help thin congestion. If you have a humidifier, running in the bedroom at night. I want you to start OTC vitamin D3 1000 units daily, vitamin C 1000 mg daily, and a zinc supplement. Please take prescribed medications as directed, including the antiviral medication - molnupiravir.  You have been enrolled in a MyChart symptom monitoring program. Please answer these questions daily so we can keep track of how you are doing.  You were to quarantine for 5 days from onset of your symptoms.  After day 5, if you have had no fever and you are feeling better, you can end quarantine but need to mask for an additional 5 days. After day 5 if you have a fever or are having significant symptoms, please  quarantine for full 10 days.  If you note any worsening of symptoms, any significant shortness of breath or any chest pain, please seek ER evaluation ASAP.  Please do not delay care!    If you have been instructed to have an in-person evaluation today at a local Urgent Care facility, please use the link below. It will take you to a list of all of our available [facility], including address, phone number and hours of operation. Please do not delay care.  [facility]  If you or a family  member do not have a primary care provider, use the link below to schedule a visit and establish care. When you choose a [facility] primary care physician or advanced practice provider, you gain a long-term partner in health. Find a Primary Care Provider  Learn more about [facility]'s in-office and virtual care options: [facility] - Get Care Now

## 2020-09-11 NOTE — Progress Notes (Signed)
Virtual Visit Consent   Nicolas Willis, you are scheduled for a virtual visit with a [facility] provider today.     Just as with appointments in the office, your consent must be obtained to participate.  Your consent will be active for this visit and any virtual visit you may have with one of our providers in the next 365 days.     If you have a MyChart account, a copy of this consent can be sent to you electronically.  All virtual visits are billed to your insurance company just like a traditional visit in the office.    As this is a virtual visit, video technology does not allow for your provider to perform a traditional examination.  This may limit your provider's ability to fully assess your condition.  If your provider identifies any concerns that need to be evaluated in person or the need to arrange testing (such as labs, EKG, etc.), we will make arrangements to do so.     Although advances in technology are sophisticated, we cannot ensure that it will always work on either your end or our end.  If the connection with a video visit is poor, the visit may have to be switched to a telephone visit.  With either a video or telephone visit, we are not always able to ensure that we have a secure connection.     I need to obtain your verbal consent now.   Are you willing to proceed with your visit today?    Nicolas Willis has provided verbal consent on 09/11/2020 for a virtual visit (video or telephone).   Piedad Climes, [place]   Date: 09/11/2020 10:01 AM  Virtual Visit via Video Note   I, Piedad Climes, connected with Nicolas Willis ([id number], May 13, 1965) on 09/11/20 at  9:45 AM EDT by a video-enabled telemedicine application and verified that I am speaking with the correct person using two identifiers.  Location: Patient: Virtual Visit Location Patient: Home Provider: Virtual Visit Location Provider: Home Office   I discussed the limitations of evaluation and management by  telemedicine and the availability of in person appointments. The patient expressed understanding and agreed to proceed.    History of Present Illness: Nicolas Willis is a 55 y.o. who identifies as a male who was assigned male at birth, and is being seen today for + COVID-19. Notes that Sunday night about 1AM starting feeling very tired and having body aches. This progressed into continued body aches, nasal congestion, sinus pressure, chills. Denies sore throat or odynophagia. Denies GI symptoms. Denies chest pain or SOB. Has been taking Sudafed to help with sinus symptoms. Also taking NSAIDs for body aches. Has taken a home test which was positive.   HPI: HPI  Problems:  Patient Active Problem List   Diagnosis Date Noted   Acute respiratory disease due to COVID-19 virus 05/21/2019   Asthma    Hypertensive urgency     Allergies: No Known Allergies Medications:  Current Outpatient Medications:    molnupiravir EUA 200 mg CAPS, Take 4 capsules (800 mg total) by mouth 2 (two) times daily for 5 days., Disp: 40 capsule, Rfl: 0   acetaminophen (TYLENOL) 325 MG tablet, Take 2 tablets (650 mg total) by mouth every 6 (six) hours as needed for mild pain or headache (fever >/= 101)., Disp:  , Rfl:    albuterol (PROVENTIL HFA) 108 (90 Base) MCG/ACT inhaler, Inhale two puffs every four to six hours as needed for cough or  wheeze., Disp: 18 g, Rfl: 1   amLODipine (NORVASC) 5 MG tablet, Take 5 mg by mouth at bedtime. , Disp: , Rfl:    EPINEPHrine (EPIPEN 2-PAK) 0.3 mg/0.3 mL IJ SOAJ injection, Use as directed for life threatening allergic reactions (Patient taking differently: Inject 0.3 mg into the muscle as needed for anaphylaxis.), Disp: 2 Device, Rfl: 3   fexofenadine (ALLEGRA) 180 MG tablet, Take 180 mg by mouth daily., Disp: , Rfl:    loratadine (CLARITIN) 10 MG tablet, Take 10 mg by mouth daily as needed for allergies. , Disp: , Rfl:    mometasone (NASONEX) 50 MCG/ACT nasal spray, Use one spray in each  nostril once or twice daily (Patient taking differently: Place 1 spray into the nose at bedtime.), Disp: 17 g, Rfl: 11   montelukast (SINGULAIR) 10 MG tablet, Take one tablet once daily as directed, Disp: 30 tablet, Rfl: 2   NUCALA 100 MG SOLR, INJECT 100MG  SUBCUTANEOUSLY EVERY 4 WEEKS., Disp: 1 each, Rfl: 10   pantoprazole (PROTONIX) 40 MG tablet, Take 40 mg by mouth at bedtime. , Disp: , Rfl:    Water For Injection Sterile (STERILE WATER, PRESERVATIVE FREE,) injection, USE AS DIRECTED, Disp: 1000 mL, Rfl: 10  Current Facility-Administered Medications:    Mepolizumab SOLR 100 mg, 100 mg, Subcutaneous, Q28 days, Kozlow, , MD, 100 mg at 08/27/20 1732  Observations/Objective: Patient is well-developed, well-nourished in no acute distress.  Resting comfortably at home.  Head is normocephalic, atraumatic.  No labored breathing. Speech is clear and coherent with logical content.  Patient is alert and oriented at baseline.   Assessment and Plan: 1. COVID-19 - MyChart COVID-19 home monitoring program; Future - molnupiravir EUA 200 mg CAPS; Take 4 capsules (800 mg total) by mouth 2 (two) times daily for 5 days.  Dispense: 40 capsule; Refill: 0 Patient with history of allergic asthma, on immunosuppressant medicines. Is candidate for antiviral. Reviewed pros/cons of antivirals. He is a candidate for molnupiravir. Discussed possible ADRs. He would like to proceed. Rx sent to pharmacy. Supportive measures, Vitamin regimen and OTC medications reviewed. Discussed quarantine. He has been enrolled ina MyChart COVID monitoring program. Strict ER precautions reviewed.   Follow Up Instructions: I discussed the assessment and treatment plan with the patient. The patient was provided an opportunity to ask questions and all were answered. The patient agreed with the plan and demonstrated an understanding of the instructions.  A copy of instructions were sent to the patient via MyChart.  The patient was  advised to call back or seek an in-person evaluation if the symptoms worsen or if the condition fails to improve as anticipated.  Time:  I spent 14 minutes with the patient via telehealth technology discussing the above problems/concerns.    08/29/20, PA-C

## 2020-09-24 ENCOUNTER — Other Ambulatory Visit: Payer: Self-pay

## 2020-09-24 ENCOUNTER — Ambulatory Visit (INDEPENDENT_AMBULATORY_CARE_PROVIDER_SITE_OTHER): Payer: Managed Care, Other (non HMO) | Admitting: *Deleted

## 2020-09-24 DIAGNOSIS — J4550 Severe persistent asthma, uncomplicated: Secondary | ICD-10-CM | POA: Diagnosis not present

## 2020-10-22 ENCOUNTER — Other Ambulatory Visit: Payer: Self-pay

## 2020-10-22 ENCOUNTER — Ambulatory Visit (INDEPENDENT_AMBULATORY_CARE_PROVIDER_SITE_OTHER): Payer: Managed Care, Other (non HMO) | Admitting: *Deleted

## 2020-10-22 DIAGNOSIS — J4550 Severe persistent asthma, uncomplicated: Secondary | ICD-10-CM | POA: Diagnosis not present

## 2020-11-20 ENCOUNTER — Ambulatory Visit (INDEPENDENT_AMBULATORY_CARE_PROVIDER_SITE_OTHER): Payer: Managed Care, Other (non HMO)

## 2020-11-20 ENCOUNTER — Other Ambulatory Visit: Payer: Self-pay

## 2020-11-20 DIAGNOSIS — J4550 Severe persistent asthma, uncomplicated: Secondary | ICD-10-CM | POA: Diagnosis not present

## 2020-12-18 ENCOUNTER — Ambulatory Visit: Payer: Managed Care, Other (non HMO)

## 2020-12-18 ENCOUNTER — Other Ambulatory Visit: Payer: Self-pay

## 2020-12-18 ENCOUNTER — Ambulatory Visit (INDEPENDENT_AMBULATORY_CARE_PROVIDER_SITE_OTHER): Payer: Managed Care, Other (non HMO) | Admitting: *Deleted

## 2020-12-18 DIAGNOSIS — J4550 Severe persistent asthma, uncomplicated: Secondary | ICD-10-CM

## 2020-12-20 ENCOUNTER — Ambulatory Visit: Payer: Managed Care, Other (non HMO)

## 2021-01-15 ENCOUNTER — Other Ambulatory Visit: Payer: Self-pay

## 2021-01-15 ENCOUNTER — Ambulatory Visit (INDEPENDENT_AMBULATORY_CARE_PROVIDER_SITE_OTHER): Payer: Managed Care, Other (non HMO)

## 2021-01-15 DIAGNOSIS — J4550 Severe persistent asthma, uncomplicated: Secondary | ICD-10-CM | POA: Diagnosis not present

## 2021-02-02 ENCOUNTER — Telehealth: Payer: Managed Care, Other (non HMO) | Admitting: Nurse Practitioner

## 2021-02-02 DIAGNOSIS — R051 Acute cough: Secondary | ICD-10-CM

## 2021-02-02 MED ORDER — PREDNISONE 20 MG PO TABS: 40.0000 mg | ORAL_TABLET | Freq: Every day | ORAL | 0 refills | Status: AC

## 2021-02-02 NOTE — Addendum Note (Signed)
Addended by: Bennie Pierini on: 02/02/2021 09:16 AM   Modules accepted: Orders

## 2021-02-02 NOTE — Progress Notes (Signed)
We are sorry that you are not feeling well.  Here is how we plan to help!  Based on your presentation I believe you most likely have A cough due to a virus.  This is called viral bronchitis and is best treated by rest, plenty of fluids and control of the cough.  You may use Ibuprofen or Tylenol as directed to help your symptoms.     In addition you may use A non-prescription cough medication called Mucinex DM: take 2 tablets every 12 hours. Thiis cough med will help get phlegm out of back of throat  I have sent in prescription for Prednisone 20mg  1 po daily for 5 days  From your responses in the eVisit questionnaire you describe inflammation in the upper respiratory tract which is causing a significant cough.  This is commonly called Bronchitis and has four common causes:   Allergies Viral Infections Acid Reflux Bacterial Infection Allergies, viruses and acid reflux are treated by controlling symptoms or eliminating the cause. An example might be a cough caused by taking certain blood pressure medications. You stop the cough by changing the medication. Another example might be a cough caused by acid reflux. Controlling the reflux helps control the cough.  USE OF BRONCHODILATOR ("RESCUE") INHALERS: There is a risk from using your bronchodilator too frequently.  The risk is that over-reliance on a medication which only relaxes the muscles surrounding the breathing tubes can reduce the effectiveness of medications prescribed to reduce swelling and congestion of the tubes themselves.  Although you feel brief relief from the bronchodilator inhaler, your asthma may actually be worsening with the tubes becoming more swollen and filled with mucus.  This can delay other crucial treatments, such as oral steroid medications. If you need to use a bronchodilator inhaler daily, several times per day, you should discuss this with your provider.  There are probably better treatments that could be used to keep your  asthma under control.     HOME CARE Only take medications as instructed by your medical team. Complete the entire course of an antibiotic. Drink plenty of fluids and get plenty of rest. Avoid close contacts especially the very young and the elderly Cover your mouth if you cough or cough into your sleeve. Always remember to wash your hands A steam or ultrasonic humidifier can help congestion.   GET HELP RIGHT AWAY IF: You develop worsening fever. You become short of breath You cough up blood. Your symptoms persist after you have completed your treatment plan MAKE SURE YOU  Understand these instructions. Will watch your condition. Will get help right away if you are not doing well or get worse.    Thank you for choosing an e-visit.  Your e-visit answers were reviewed by a board certified advanced clinical practitioner to complete your personal care plan. Depending upon the condition, your plan could have included both over the counter or prescription medications.  Please review your pharmacy choice. Make sure the pharmacy is open so you can pick up prescription now. If there is a problem, you may contact your provider through and have the prescription routed to another pharmacy.  Your safety is important to [employer]. If you have drug allergies check your prescription carefully.   For the next 24 hours you can use MyChart to ask questions about today's visit, request a non-urgent call back, or ask for a work or school excuse. You will get an email in the next two days asking about your experience.  I hope that your e-visit has been valuable and will speed your recovery.  5-10 minutes spent reviewing and documenting in chart.

## 2021-02-12 ENCOUNTER — Other Ambulatory Visit: Payer: Self-pay

## 2021-02-12 ENCOUNTER — Ambulatory Visit (INDEPENDENT_AMBULATORY_CARE_PROVIDER_SITE_OTHER): Payer: Managed Care, Other (non HMO)

## 2021-02-12 DIAGNOSIS — J4550 Severe persistent asthma, uncomplicated: Secondary | ICD-10-CM | POA: Diagnosis not present

## 2021-03-12 ENCOUNTER — Other Ambulatory Visit: Payer: Self-pay

## 2021-03-12 ENCOUNTER — Ambulatory Visit (INDEPENDENT_AMBULATORY_CARE_PROVIDER_SITE_OTHER): Payer: Managed Care, Other (non HMO) | Admitting: *Deleted

## 2021-03-12 DIAGNOSIS — J4550 Severe persistent asthma, uncomplicated: Secondary | ICD-10-CM | POA: Diagnosis not present

## 2021-03-13 ENCOUNTER — Other Ambulatory Visit: Payer: Self-pay | Admitting: Allergy and Immunology

## 2021-03-14 ENCOUNTER — Other Ambulatory Visit: Payer: Self-pay | Admitting: *Deleted

## 2021-03-14 MED ORDER — NUCALA 100 MG/ML ~~LOC~~ SOSY
100.0000 mg | PREFILLED_SYRINGE | SUBCUTANEOUS | 11 refills | Status: DC
Start: 2021-03-14 — End: 2021-10-03

## 2021-03-22 NOTE — Progress Notes (Signed)
[facility] [place] Phone: [phone] Subjective:   Fontaine No, am serving as a scribe for Dr. Hulan Saas. This visit occurred during the SARS-CoV-2 public health emergency.  Safety protocols were in place, including screening questions prior to the visit, additional usage of staff PPE, and extensive cleaning of exam room while observing appropriate contact time as indicated for disinfecting solutions.  I'm seeing this patient by the request  of:  Myrlene Broker, MD  CC: Left knee pain  [id number]  Marissa Koskinen is a 56 y.o. male coming in with complaint of medial right knee pain. 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 Using brace and KT tape for pain relief. Sitting for prolonged periods increases his pain. Was put on meloxicam last year.       Past Medical History:  Diagnosis Date   Asthma    Frequent sinus infections    Past Surgical History:  Procedure Laterality Date   SINOSCOPY     Social History   Socioeconomic History   Marital status: Married    Spouse name: Not on file   Number of children: Not on file   Years of education: Not on file   Highest education level: Not on file  Occupational History   Not on file  Tobacco Use   Smoking status: Never   Smokeless tobacco: Never  Substance and Sexual Activity   Alcohol use: No   Drug use: No   Sexual activity: Not on file  Other Topics Concern   Not on file  Social History Narrative   Not on file   Social Determinants of Health   Financial Resource Strain: Not on file  Food Insecurity: Not on file  Transportation Needs: Not on file  Physical Activity: Not on file  Stress: Not on file  Social Connections: Not on file   No Known Allergies Family History  Problem Relation Age of Onset   Leukemia Mother    Heart disease Maternal Grandmother    Heart disease Maternal Grandfather       Current  Outpatient Medications (Cardiovascular):    amLODipine (NORVASC) 5 MG tablet, Take 5 mg by mouth at bedtime.    EPINEPHrine (EPIPEN 2-PAK) 0.3 mg/0.3 mL IJ SOAJ injection, Use as directed for life threatening allergic reactions (Patient taking differently: Inject 0.3 mg into the muscle as needed for anaphylaxis.)   Current Outpatient Medications (Respiratory):    albuterol (PROVENTIL HFA) 108 (90 Base) MCG/ACT inhaler, Inhale two puffs every four to six hours as needed for cough or wheeze.   fexofenadine (ALLEGRA) 180 MG tablet, Take 180 mg by mouth daily.   loratadine (CLARITIN) 10 MG tablet, Take 10 mg by mouth daily as needed for allergies.    mepolizumab (NUCALA) 100 MG/ML SOSY, Inject 100 mg into the skin every 28 (twenty-eight) days.   mometasone (NASONEX) 50 MCG/ACT nasal spray, Use one spray in each nostril once or twice daily (Patient taking differently: Place 1 spray into the nose at bedtime.)   montelukast (SINGULAIR) 10 MG tablet, Take one tablet once daily as directed  Current Facility-Administered Medications (Respiratory):    Mepolizumab SOLR 100 mg  Current Outpatient Medications (Analgesics):    acetaminophen (TYLENOL) 325 MG tablet, Take 2 tablets (650 mg total) by mouth every 6 (six) hours as needed for mild pain or headache (fever >/= 101).     Current Outpatient Medications (Other):  pantoprazole (PROTONIX) 40 MG tablet, Take 40 mg by mouth at bedtime.    Water For Injection Sterile (STERILE WATER, PRESERVATIVE FREE,) injection, USE AS DIRECTED    Reviewed prior external information including notes and imaging from  primary care provider As well as notes that were available from care everywhere and other healthcare systems.  Past medical history, social, surgical and family history all reviewed in electronic medical record.  No pertanent information unless stated regarding to the chief complaint.   Review of Systems:  No headache, visual changes, nausea,  vomiting, diarrhea, constipation, dizziness, abdominal pain, skin rash, fevers, chills, night sweats, weight loss, swollen lymph nodes, body aches, joint swelling, chest pain, shortness of breath, mood changes. POSITIVE muscle aches  Objective  Blood pressure (!) 150/92, pulse 90, height 5\' 10"  (1.778 m), weight 221 lb (100.2 kg), SpO2 96 %.   General: No apparent distress alert and oriented x3 mood and affect normal, dressed appropriately.  HEENT: Pupils equal, extraocular movements intact  Respiratory: Patient's speak in full sentences and does not appear short of breath  Cardiovascular: No lower extremity edema, non tender, no erythema  Gait normal with good balance and coordination.  MSK:  left knee Mild tenderness to palpation over the medial aspect.  Positive McMurray's noted.  Patient does have good range of motion.  No significant instability  Limited muscular skeletal ultrasound was performed and interpreted by Hulan Saas, M  Hypoechoic changes around the medial meniscus spine does have displacement of 25% displacement posteriorly.  Patient does have a trace effusion noted of the patellofemoral joint as well. 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 Right knee was prepped with alcohol swab and utilizing anterolateral approach, patient's right knee space was injected with 4:1  marcaine 0.5%: Kenalog 40mg /dL. Patient tolerated the procedure well without immediate complications.   Impression and Recommendations:     The above documentation has been reviewed and is accurate and complete Lyndal Pulley, DO

## 2021-03-26 ENCOUNTER — Other Ambulatory Visit: Payer: Self-pay

## 2021-03-26 ENCOUNTER — Ambulatory Visit: Payer: Managed Care, Other (non HMO) | Admitting: Family Medicine

## 2021-03-26 ENCOUNTER — Ambulatory Visit: Payer: Self-pay

## 2021-03-26 VITALS — BP 150/92 | HR 90 | Ht 70.0 in | Wt 221.0 lb

## 2021-03-26 DIAGNOSIS — S83241A Other tear of medial meniscus, current injury, right knee, initial encounter: Secondary | ICD-10-CM | POA: Diagnosis not present

## 2021-03-26 DIAGNOSIS — M25562 Pain in left knee: Secondary | ICD-10-CM | POA: Diagnosis not present

## 2021-03-26 NOTE — Patient Instructions (Signed)
Injection today Mensicus tear Injection today Ice  Voltaren gel See me in 6 weeks

## 2021-03-27 ENCOUNTER — Encounter: Payer: Self-pay | Admitting: Family Medicine

## 2021-03-27 DIAGNOSIS — S83241A Other tear of medial meniscus, current injury, right knee, initial encounter: Secondary | ICD-10-CM | POA: Insufficient documentation

## 2021-03-27 NOTE — Assessment & Plan Note (Signed)
Patient given injection and tolerated the procedure well, discussed icing regimen.  Discussed avoiding twisting motions.  Patient does have an effusion and hopefully that the injection will help.  Increase activity slowly.  Follow-up again in 6 to 8 weeks

## 2021-04-09 ENCOUNTER — Ambulatory Visit: Payer: Managed Care, Other (non HMO)

## 2021-04-10 ENCOUNTER — Ambulatory Visit (INDEPENDENT_AMBULATORY_CARE_PROVIDER_SITE_OTHER): Payer: Managed Care, Other (non HMO) | Admitting: *Deleted

## 2021-04-10 ENCOUNTER — Other Ambulatory Visit: Payer: Self-pay

## 2021-04-10 DIAGNOSIS — J4550 Severe persistent asthma, uncomplicated: Secondary | ICD-10-CM | POA: Diagnosis not present

## 2021-05-02 NOTE — Progress Notes (Signed)
[name] [address] [place] [address] Phone: [phone] Subjective:   Bruce Donath, am serving as a scribe for Dr. Antoine Primas. This visit occurred during the SARS-CoV-2 public health emergency.  Safety protocols were in place, including screening questions prior to the visit, additional usage of staff PPE, and extensive cleaning of exam room while observing appropriate contact time as indicated for disinfecting solutions.  I'm seeing this patient by the request  of:  Hadley Pen, MD  CC: Right knee pain  [id number]  03/26/2021 Patient given injection and tolerated the procedure well, discussed icing regimen.  Discussed avoiding twisting motions.  Patient does have an effusion and hopefully that the injection will help.  Increase activity slowly.  Follow-up again in 6 to 8 weeks  Update 05/07/2021 Nicolas Willis is a 56 y.o. male coming in with complaint of R knee pain. Patient states that he reinjured his R knee as he was over confident from injection. Twisted knee with planted foot. Pain in posterior aspect of knee that has dissipated. Pain is 1/10. Pain is more in the front of knee today.  Patient states states that unfortunately has to get up very slowly.  Sometimes does have significant stiffness noted.  States that sometimes it does feel like it is locking.       Past Medical History:  Diagnosis Date   Asthma    Frequent sinus infections    Past Surgical History:  Procedure Laterality Date   SINOSCOPY     Social History   Socioeconomic History   Marital status: Married    Spouse name: Not on file   Number of children: Not on file   Years of education: Not on file   Highest education level: Not on file  Occupational History   Not on file  Tobacco Use   Smoking status: Never   Smokeless tobacco: Never  Substance and Sexual Activity   Alcohol use: No   Drug use: No   Sexual activity: Not on file  Other Topics  Concern   Not on file  Social History Narrative   Not on file   Social Determinants of Health   Financial Resource Strain: Not on file  Food Insecurity: Not on file  Transportation Needs: Not on file  Physical Activity: Not on file  Stress: Not on file  Social Connections: Not on file   No Known Allergies Family History  Problem Relation Age of Onset   Leukemia Mother    Heart disease Maternal Grandmother    Heart disease Maternal Grandfather       Current Outpatient Medications (Cardiovascular):    amLODipine (NORVASC) 5 MG tablet, Take 5 mg by mouth at bedtime.    EPINEPHrine (EPIPEN 2-PAK) 0.3 mg/0.3 mL IJ SOAJ injection, Use as directed for life threatening allergic reactions (Patient taking differently: Inject 0.3 mg into the muscle as needed for anaphylaxis.)   Current Outpatient Medications (Respiratory):    albuterol (PROVENTIL HFA) 108 (90 Base) MCG/ACT inhaler, Inhale two puffs every four to six hours as needed for cough or wheeze.   fexofenadine (ALLEGRA) 180 MG tablet, Take 180 mg by mouth daily.   loratadine (CLARITIN) 10 MG tablet, Take 10 mg by mouth daily as needed for allergies.    mepolizumab (NUCALA) 100 MG/ML SOSY, Inject 100 mg into the skin every 28 (twenty-eight) days.   mometasone (NASONEX) 50 MCG/ACT nasal spray, Use one spray in each nostril once or twice daily (Patient  taking differently: Place 1 spray into the nose at bedtime.)   montelukast (SINGULAIR) 10 MG tablet, Take one tablet once daily as directed  Current Facility-Administered Medications (Respiratory):    Mepolizumab SOLR 100 mg  Current Outpatient Medications (Analgesics):    acetaminophen (TYLENOL) 325 MG tablet, Take 2 tablets (650 mg total) by mouth every 6 (six) hours as needed for mild pain or headache (fever >/= 101).     Current Outpatient Medications (Other):    pantoprazole (PROTONIX) 40 MG tablet, Take 40 mg by mouth at bedtime.    Water For Injection Sterile (STERILE  WATER, PRESERVATIVE FREE,) injection, USE AS DIRECTED    Reviewed prior external information including notes and imaging from  primary care provider As well as notes that were available from care everywhere and other healthcare systems.  Past medical history, social, surgical and family history all reviewed in electronic medical record.  No pertanent information unless stated regarding to the chief complaint.   Review of Systems:  No headache, visual changes, nausea, vomiting, diarrhea, constipation, dizziness, abdominal pain, skin rash, fevers, chills, night sweats, weight loss, swollen lymph nodes, body aches, joint swelling, chest pain, shortness of breath, mood changes. POSITIVE muscle aches  Objective  Blood pressure (!) 138/98, pulse 86, height 5\' 10"  (1.778 m), weight 224 lb (101.6 kg), SpO2 98 %.   General: No apparent distress alert and oriented x3 mood and affect normal, dressed appropriately.  HEENT: Pupils equal, extraocular movements intact  Respiratory: Patient's speak in full sentences and does not appear short of breath  Cardiovascular: No lower extremity edema, non tender, no erythema  Gait continued antalgic gait, patient avoids full extension of the knee.  Patient lacks actually the last 10 degrees of extension which is worse in the last 10 degrees of flexion.  Patient does have a positive McMurray's with tenderness to palpation over the medial joint line.  Limited muscular skeletal ultrasound was performed and interpreted by , M  Limited ultrasound shows the patient continues to have an effusion noted of the patellofemoral joint. Patient does have either some loose bodies it appears that is calcific in nature.  Patient also has  His medial meniscus likely having a tear and does have displacement of approximately 25% of the posterior medial meniscus.  Questionable early Baker's cyst noted as well. Impression: Continued knee effusion with good questionable  loose bodies and medial meniscal tear with displacement    Impression and Recommendations:     The above documentation has been reviewed and is accurate and complete Antoine Primas, DO

## 2021-05-07 ENCOUNTER — Ambulatory Visit: Payer: Self-pay

## 2021-05-07 ENCOUNTER — Other Ambulatory Visit: Payer: Self-pay

## 2021-05-07 ENCOUNTER — Encounter: Payer: Self-pay | Admitting: Family Medicine

## 2021-05-07 ENCOUNTER — Ambulatory Visit (INDEPENDENT_AMBULATORY_CARE_PROVIDER_SITE_OTHER): Payer: Managed Care, Other (non HMO) | Admitting: Family Medicine

## 2021-05-07 VITALS — BP 138/98 | HR 86 | Ht 70.0 in | Wt 224.0 lb

## 2021-05-07 DIAGNOSIS — G8929 Other chronic pain: Secondary | ICD-10-CM

## 2021-05-07 DIAGNOSIS — S83241D Other tear of medial meniscus, current injury, right knee, subsequent encounter: Secondary | ICD-10-CM | POA: Diagnosis not present

## 2021-05-07 DIAGNOSIS — M25561 Pain in right knee: Secondary | ICD-10-CM | POA: Diagnosis not present

## 2021-05-07 NOTE — Patient Instructions (Signed)
[facility] [phone] Call Today  When we receive your results we will contact you.  

## 2021-05-07 NOTE — Assessment & Plan Note (Signed)
Patient continues to have difficulty follow-up Patient does have a worsening of her pain and locking of his knee.  Patient has difficulty getting up from a sitting position.  Patient will increase activity but unfortunately anytime he does starts having more discomfort.  Patient is highly active in his job and does need a MRI to further evaluate.  We are talking patient's likelihood.  Increase activity slowly otherwise.  Follow-up with me again in 4 to 8 weeks

## 2021-05-08 ENCOUNTER — Ambulatory Visit (INDEPENDENT_AMBULATORY_CARE_PROVIDER_SITE_OTHER): Payer: Managed Care, Other (non HMO) | Admitting: *Deleted

## 2021-05-08 DIAGNOSIS — J4550 Severe persistent asthma, uncomplicated: Secondary | ICD-10-CM

## 2021-05-12 ENCOUNTER — Ambulatory Visit
Admission: RE | Admit: 2021-05-12 | Discharge: 2021-05-12 | Disposition: A | Discharge status: Home / Self Care | Payer: Managed Care, Other (non HMO) | Source: Ambulatory Visit | Attending: Family Medicine | Admitting: Family Medicine

## 2021-05-12 ENCOUNTER — Other Ambulatory Visit: Payer: Self-pay

## 2021-05-12 DIAGNOSIS — G8929 Other chronic pain: Secondary | ICD-10-CM

## 2021-05-14 ENCOUNTER — Other Ambulatory Visit: Payer: Self-pay | Admitting: Family Medicine

## 2021-05-14 ENCOUNTER — Encounter: Payer: Self-pay | Admitting: Family Medicine

## 2021-05-14 DIAGNOSIS — S83241D Other tear of medial meniscus, current injury, right knee, subsequent encounter: Secondary | ICD-10-CM

## 2021-06-05 ENCOUNTER — Ambulatory Visit (INDEPENDENT_AMBULATORY_CARE_PROVIDER_SITE_OTHER): Payer: Managed Care, Other (non HMO) | Admitting: *Deleted

## 2021-06-05 ENCOUNTER — Other Ambulatory Visit: Payer: Self-pay

## 2021-06-05 DIAGNOSIS — J4550 Severe persistent asthma, uncomplicated: Secondary | ICD-10-CM | POA: Diagnosis not present

## 2021-07-03 ENCOUNTER — Ambulatory Visit (INDEPENDENT_AMBULATORY_CARE_PROVIDER_SITE_OTHER): Payer: Managed Care, Other (non HMO) | Admitting: *Deleted

## 2021-07-03 DIAGNOSIS — J4550 Severe persistent asthma, uncomplicated: Secondary | ICD-10-CM

## 2021-07-18 ENCOUNTER — Ambulatory Visit: Payer: Managed Care, Other (non HMO) | Admitting: Allergy and Immunology

## 2021-07-18 ENCOUNTER — Encounter: Payer: Self-pay | Admitting: Allergy and Immunology

## 2021-07-18 VITALS — BP 150/98 | HR 80 | Resp 14 | Ht 69.5 in | Wt 224.0 lb

## 2021-07-18 DIAGNOSIS — J339 Nasal polyp, unspecified: Secondary | ICD-10-CM | POA: Diagnosis not present

## 2021-07-18 DIAGNOSIS — J4550 Severe persistent asthma, uncomplicated: Secondary | ICD-10-CM | POA: Diagnosis not present

## 2021-07-18 DIAGNOSIS — J3089 Other allergic rhinitis: Secondary | ICD-10-CM | POA: Diagnosis not present

## 2021-07-18 MED ORDER — METHYLPREDNISOLONE ACETATE 80 MG/ML IJ SUSP
80.0000 mg | Freq: Once | INTRAMUSCULAR | Status: AC
  Administered 2021-07-18: 80 mg via INTRAMUSCULAR

## 2021-07-18 MED ORDER — BREZTRI AEROSPHERE 160-9-4.8 MCG/ACT IN AERO: INHALATION_SPRAY | RESPIRATORY_TRACT | 5 refills | Status: DC

## 2021-07-18 NOTE — Patient Instructions (Addendum)
?  ?  1. Continue to perform Allergen avoidance measures as best as possible ? ?2. Continue to Treat and prevent inflammation: ? ? A. Nasonex - 1 spray each nostril Daily ? B. montelukast 10 mg - 1 tablet Daily ? C. Mepolizumab injections ? ?3. If needed: ? ? A. Proventil HFA 2 puffs every 4-6 hours ? B. loratadine 10 mg one tablet one time per day ? C. nasal saline wash ? ?4. For this recent issue: ? ? A. Start Breztri - 2 inhalations 2 times per day (empty lungs) ? B. Depomedrol 80 mg IM delivered in clinic today ? ?5.  Return to clinic in 4 weeks. Further evaluation??? ? ?6. Review chest x-ray from [place].  ? ?

## 2021-07-18 NOTE — Progress Notes (Signed)
? ?Nicolas Willis ? ? ?Follow-up Note ? ?Referring Provider: Myrlene Broker, MD ?Primary Provider: Myrlene Broker, MD ?Date of Office Visit: 07/18/2021 ? ?Subjective:  ? ?Nicolas Willis (DOB: January 28, 1966) is a 56 y.o. male who returns to the [facility] on 07/18/2021 in re-evaluation of the following: ? ?HPI: Nicolas Willis returns to this clinic in evaluation of severe asthma, allergic rhinitis, nasal polyposis.  His last visit to this clinic was 30 May 2020. ? ?He has noticed over the course of the past 3 months that he has developed more problems with coughing and some wheezing and has been using his bronchodilator on a daily basis even though he continues to use mepolizumab consistently.  He does not think that this issue is interfering with his ability to exercise.  He does have some throat clearing but he has no raspy voice and he has no classic reflux symptoms.  He believes his nose is doing well although intermittently gets a little bit stuffy here and there.  He can smell and taste without any problem. ? ?He had a chest x-ray performed in February 2023 at the [facility] in evaluation of his service connection.  Presently he is service-connected by 20% secondary to his asthma.  Apparently his chest x-ray was normal. ? ?Allergies as of 07/18/2021   ?No Known Allergies ?  ? ?  ?Medication List  ? ? ?acetaminophen 325 MG tablet ?Commonly known as: TYLENOL ?Take 2 tablets (650 mg total) by mouth every 6 (six) hours as needed for mild pain or headache (fever >/= 101). ?  ?albuterol 108 (90 Base) MCG/ACT inhaler ?Commonly known as: Proventil HFA ?Inhale two puffs every four to six hours as needed for cough or wheeze. ?  ?amLODipine 5 MG tablet ?Commonly known as: NORVASC ?Take 5 mg by mouth at bedtime. ?  ?fexofenadine 180 MG tablet ?Commonly known as: ALLEGRA ?Take 180 mg by mouth daily. ?  ?meloxicam 15 MG tablet ?Commonly known as: MOBIC ?Take 15 mg by mouth  daily. ?  ?mometasone 50 MCG/ACT nasal spray ?Commonly known as: Nasonex ?Use one spray in each nostril once or twice daily ?  ?montelukast 10 MG tablet ?Commonly known as: SINGULAIR ?Take by mouth. ?  ?Nucala 100 MG/ML Sosy ?Generic drug: mepolizumab ?Inject 100 mg into the skin every 28 (twenty-eight) days. ?  ?pantoprazole 40 MG tablet ?Commonly known as: PROTONIX ?Take 40 mg by mouth at bedtime. ?  ?sterile water (preservative free) injection ?USE AS DIRECTED ?  ? ?Past Medical History:  ?Diagnosis Date  ? Asthma   ? Frequent sinus infections   ? ? ?Past Surgical History:  ?Procedure Laterality Date  ? SINOSCOPY    ? ? ?Review of systems negative except as noted in HPI / PMHx or noted below: ? ?Review of Systems  ?Constitutional: Negative.   ?HENT: Negative.    ?Eyes: Negative.   ?Respiratory: Negative.    ?Cardiovascular: Negative.   ?Gastrointestinal: Negative.   ?Genitourinary: Negative.   ?Musculoskeletal: Negative.   ?Skin: Negative.   ?Neurological: Negative.   ?Endo/Heme/Allergies: Negative.   ?Psychiatric/Behavioral: Negative.    ? ? ?Objective:  ? ?Vitals:  ? 07/18/21 1105 07/18/21 1135  ?BP: (!) 166/98 (!) 150/98  ?Pulse: 80   ?Resp: 14   ?SpO2: 96%   ? ?Height: 5' 9.5" (176.5 cm)  ?Weight: 224 lb (101.6 kg)  ? ?Physical Exam ?Constitutional:   ?   Appearance: He is not diaphoretic.  ?  HENT:  ?   Head: Normocephalic.  ?   Right Ear: Tympanic membrane, ear canal and external ear normal.  ?   Left Ear: Tympanic membrane, ear canal and external ear normal.  ?   Nose: Nose normal. No mucosal edema or rhinorrhea.  ?   Mouth/Throat:  ?   Pharynx: Uvula midline. No oropharyngeal exudate.  ?Eyes:  ?   Conjunctiva/sclera: Conjunctivae normal.  ?Neck:  ?   Thyroid: No thyromegaly.  ?   Trachea: Trachea normal. No tracheal tenderness or tracheal deviation.  ?Cardiovascular:  ?   Rate and Rhythm: Normal rate and regular rhythm.  ?   Heart sounds: Normal heart sounds, S1 normal and S2 normal. No murmur  heard. ?Pulmonary:  ?   Effort: No respiratory distress.  ?   Breath sounds: Normal breath sounds. No stridor. No wheezing or rales.  ?Lymphadenopathy:  ?   Head:  ?   Right side of head: No tonsillar adenopathy.  ?   Left side of head: No tonsillar adenopathy.  ?   Cervical: No cervical adenopathy.  ?Skin: ?   Findings: No erythema or rash.  ?   Nails: There is no clubbing.  ?Neurological:  ?   Mental Status: He is alert.  ? ? ?Diagnostics:  ?  ?Spirometry was performed and demonstrated an FEV1 of 3.18 at 83 % of predicted. ? ?Assessment and Plan:  ? ?1. Not well controlled severe persistent asthma   ?2. Other allergic rhinitis   ?3. Nasal polyposis   ? ?  ?1. Continue to perform Allergen avoidance measures as best as possible ? ?2. Continue to Treat and prevent inflammation: ? ? A. Nasonex - 1 spray each nostril Daily ? B. montelukast 10 mg - 1 tablet Daily ? C. Mepolizumab injections ? ?3. If needed: ? ? A. Proventil HFA 2 puffs every 4-6 hours ? B. loratadine 10 mg one tablet one time per day ? C. nasal saline wash ? ?4. For this recent issue: ? ? A. Start Breztri - 2 inhalations 2 times per day (empty lungs) ? B. Depomedrol 80 mg IM delivered in clinic today ? ?5.  Return to clinic in 4 weeks. Further evaluation??? ? ?6. Review chest x-ray from [place].  ? ?Nicolas Willis appears to have a little bit more respiratory tract inflammation and irritation that has developed since January 2023 in the face of utilizing an anti-IL-5 biologic agent and leukotriene modifier and a nasal steroid.  Given the fact that he has had a normal chest x-ray obtained at the [place] in February 2023 we will assume that he has respiratory tract inflammation from pollen exposure and we will have him start a triple inhaler as noted above and I given him a injection of systemic steroid today.  Should he still continues to have problems with his respiratory tract he will certainly require further evaluation.  Specifically, if he continues to have issues  with throat clearing and cough then we will need to have his throat evaluated with the ENT for thorough evaluation of his laryngeal structures.  I will see him back in this clinic in 4 weeks. ? ?Allena Katz, MD ?Allergy / Immunology ?[place] ?

## 2021-07-22 ENCOUNTER — Encounter: Payer: Self-pay | Admitting: Allergy and Immunology

## 2021-07-31 ENCOUNTER — Ambulatory Visit (INDEPENDENT_AMBULATORY_CARE_PROVIDER_SITE_OTHER): Payer: Managed Care, Other (non HMO) | Admitting: *Deleted

## 2021-07-31 DIAGNOSIS — J4550 Severe persistent asthma, uncomplicated: Secondary | ICD-10-CM | POA: Diagnosis not present

## 2021-08-15 ENCOUNTER — Ambulatory Visit: Payer: Managed Care, Other (non HMO) | Admitting: Allergy and Immunology

## 2021-08-15 ENCOUNTER — Encounter: Payer: Self-pay | Admitting: Allergy and Immunology

## 2021-08-15 VITALS — BP 140/88 | HR 83 | Resp 16

## 2021-08-15 DIAGNOSIS — K219 Gastro-esophageal reflux disease without esophagitis: Secondary | ICD-10-CM

## 2021-08-15 DIAGNOSIS — J4550 Severe persistent asthma, uncomplicated: Secondary | ICD-10-CM | POA: Diagnosis not present

## 2021-08-15 DIAGNOSIS — J3089 Other allergic rhinitis: Secondary | ICD-10-CM | POA: Diagnosis not present

## 2021-08-15 DIAGNOSIS — J339 Nasal polyp, unspecified: Secondary | ICD-10-CM | POA: Diagnosis not present

## 2021-08-15 MED ORDER — PANTOPRAZOLE SODIUM 40 MG PO TBEC: DELAYED_RELEASE_TABLET | ORAL | 1 refills | Status: DC

## 2021-08-15 MED ORDER — ALBUTEROL SULFATE HFA 108 (90 BASE) MCG/ACT IN AERS: INHALATION_SPRAY | RESPIRATORY_TRACT | 1 refills | Status: DC

## 2021-08-15 NOTE — Patient Instructions (Addendum)
    1. Continue to perform Allergen avoidance measures as best as possible  2. Continue to Treat and prevent inflammation:   A. Nasonex - 1 spray each nostril Daily  B. montelukast 10 mg - 1 tablet Daily  C. Breztri - 2 inhalations 2 times per day (empty lungs)  D. Mepolizumab injections   3. Treat and prevent reflux / LPR:   A. Increase Protonix 40 mg - 1 tablet 2 times per day  4. If needed:   A. Proventil HFA 2 puffs every 4-6 hours  B. loratadine 10 mg one tablet one time per day  C. nasal saline wash  5. Return to clinic in August 2023 or earlier if needed

## 2021-08-15 NOTE — Progress Notes (Signed)
[place] - [place] - [place] - [place] - [place]   Follow-up Note  Referring Provider: Hadley Pen, MD Primary Provider: Hadley Pen, MD Date of Office Visit: 08/15/2021  Subjective:   Nicolas Willis (DOB: January 15, 1966) is a 56 y.o. male who [facility] in re-evaluation of the following:  HPI: Nicolas Willis returns to this clinic in evaluation of severe asthma, allergic rhinitis, and a history of nasal polyposis.  His last visit to the clinic was 18 Jul 2021.  When he was last seen in this clinic he appeared to have a respiratory tract exacerbation with coughing and wheezing and nasal stuffiness for which we gave him a systemic steroid and started him on a triple inhaler while he continued on mepolizumab and his other anti-inflammatory agents for his airway.  He is certainly a lot better regarding both his upper airway and his lower airway and that he has resolved his wheezing and most of his coughing but he still has this throat clearing like cough and feels as though there is some mucus in his throat that he can sometimes cough up.  He does continue to use his Protonix 1 time per day for reflux which is taking care of all of his classic reflux symptoms.  Allergies as of 08/15/2021   No Known Allergies      Medication List    acetaminophen 325 MG tablet Commonly known as: TYLENOL Take 2 tablets (650 mg total) by mouth every 6 (six) hours as needed for mild pain or headache (fever >/= 101).   albuterol 108 (90 Base) MCG/ACT inhaler Commonly known as: Proventil HFA Inhale two puffs every four to six hours as needed for cough or wheeze.   amLODipine 5 MG tablet Commonly known as: NORVASC Take 5 mg by mouth at bedtime.   Breztri Aerosphere 160-9-4.8 MCG/ACT Aero Generic drug: Budeson-Glycopyrrol-Formoterol Inhale two puffs twice daily to prevent cough or wheeze.  Rinse, gargle, and spit after use.   fexofenadine 180 MG  tablet Commonly known as: ALLEGRA Take 180 mg by mouth daily.   meloxicam 15 MG tablet Commonly known as: MOBIC Take 15 mg by mouth daily.   mometasone 50 MCG/ACT nasal spray Commonly known as: Nasonex Use one spray in each nostril once or twice daily   montelukast 10 MG tablet Commonly known as: SINGULAIR Take by mouth.   Nucala 100 MG/ML Sosy Generic drug: mepolizumab Inject 100 mg into the skin every 28 (twenty-eight) days.   pantoprazole 40 MG tablet Commonly known as: PROTONIX Take 40 mg by mouth at bedtime.   sterile water (preservative free) injection USE AS DIRECTED    Past Medical History:  Diagnosis Date   Asthma    Frequent sinus infections     Past Surgical History:  Procedure Laterality Date   SINOSCOPY      Review of systems negative except as noted in HPI / PMHx or noted below:  Review of Systems  Constitutional: Negative.   HENT: Negative.    Eyes: Negative.   Respiratory: Negative.    Cardiovascular: Negative.   Gastrointestinal: Negative.   Genitourinary: Negative.   Musculoskeletal: Negative.   Skin: Negative.   Neurological: Negative.   Endo/Heme/Allergies: Negative.   Psychiatric/Behavioral: Negative.      Objective:   Vitals:   08/15/21 1004  BP: 140/88  Pulse: 83  Resp: 16  SpO2: 96%          Physical Exam Constitutional:  Appearance: He is not diaphoretic.  HENT:     Head: Normocephalic.     Right Ear: Tympanic membrane, ear canal and external ear normal.     Left Ear: Tympanic membrane, ear canal and external ear normal.     Nose: Nose normal. No mucosal edema or rhinorrhea.     Mouth/Throat:     Pharynx: Uvula midline. No oropharyngeal exudate.  Eyes:     Conjunctiva/sclera: Conjunctivae normal.  Neck:     Thyroid: No thyromegaly.     Trachea: Trachea normal. No tracheal tenderness or tracheal deviation.  Cardiovascular:     Rate and Rhythm: Normal rate and regular rhythm.     Heart sounds: Normal heart  sounds, S1 normal and S2 normal. No murmur heard. Pulmonary:     Effort: No respiratory distress.     Breath sounds: Normal breath sounds. No stridor. No wheezing or rales.  Lymphadenopathy:     Head:     Right side of head: No tonsillar adenopathy.     Left side of head: No tonsillar adenopathy.     Cervical: No cervical adenopathy.  Skin:    Findings: No erythema or rash.     Nails: There is no clubbing.  Neurological:     Mental Status: He is alert.    Diagnostics:    Spirometry was performed and demonstrated an FEV1 of 3.34 at 87 % of predicted.  Assessment and Plan:   1. Not well controlled severe persistent asthma   2. Other allergic rhinitis   3. Nasal polyposis   4. LPRD (laryngopharyngeal reflux disease)     1. Continue to perform Allergen avoidance measures as best as possible  2. Continue to Treat and prevent inflammation:   A. Nasonex - 1 spray each nostril Daily  B. montelukast 10 mg - 1 tablet Daily  C. Breztri - 2 inhalations 2 times per day (empty lungs)  D.  Mepolizumab injections  3. Treat and prevent reflux / LPR:   A. Increase Protonix 40 mg - 1 tablet 2 times per day  4. If needed:   A. Proventil HFA 2 puffs every 4-6 hours  B. loratadine 10 mg one tablet one time per day  C. nasal saline wash  5. Return to clinic in August 2023 or earlier if problem    Although Nicolas Willis is certainly a lot better regarding his upper and lower respiratory tract symptoms while receiving a systemic steroid during his last visit and using a combination of anti-inflammatory agents for his airway I think that he may have exacerbated his LPR because of all of his coughing and we will now have him increase his Protonix to twice a day.  Assuming he does well with this plan I will see him back in this clinic in August 2023 and I suspect that there will be an opportunity to consolidate his treatment without visit.  Laurette Schimke, MD Allergy / Immunology [facility]

## 2021-08-19 ENCOUNTER — Encounter: Payer: Self-pay | Admitting: Allergy and Immunology

## 2021-08-28 ENCOUNTER — Ambulatory Visit (INDEPENDENT_AMBULATORY_CARE_PROVIDER_SITE_OTHER): Payer: Managed Care, Other (non HMO) | Admitting: *Deleted

## 2021-08-28 DIAGNOSIS — J4550 Severe persistent asthma, uncomplicated: Secondary | ICD-10-CM

## 2021-09-25 ENCOUNTER — Ambulatory Visit (INDEPENDENT_AMBULATORY_CARE_PROVIDER_SITE_OTHER): Payer: BC Managed Care – PPO | Admitting: *Deleted

## 2021-09-25 DIAGNOSIS — J4550 Severe persistent asthma, uncomplicated: Secondary | ICD-10-CM

## 2021-10-03 ENCOUNTER — Other Ambulatory Visit: Payer: Self-pay | Admitting: *Deleted

## 2021-10-03 MED ORDER — NUCALA 100 MG/ML ~~LOC~~ SOAJ: 100.0000 mg | SUBCUTANEOUS | 11 refills | Status: DC

## 2021-10-03 NOTE — Telephone Encounter (Signed)
Called patient wife an l/m advising approval and submit with new Ins to [employer] and change over to autoinjector per Ins

## 2021-10-16 ENCOUNTER — Ambulatory Visit: Payer: BC Managed Care – PPO | Admitting: Allergy and Immunology

## 2021-10-16 ENCOUNTER — Encounter: Payer: Self-pay | Admitting: Allergy and Immunology

## 2021-10-16 VITALS — BP 138/88 | HR 80 | Temp 98.2°F | Resp 20

## 2021-10-16 DIAGNOSIS — J3089 Other allergic rhinitis: Secondary | ICD-10-CM | POA: Diagnosis not present

## 2021-10-16 DIAGNOSIS — J4550 Severe persistent asthma, uncomplicated: Secondary | ICD-10-CM

## 2021-10-16 DIAGNOSIS — K219 Gastro-esophageal reflux disease without esophagitis: Secondary | ICD-10-CM

## 2021-10-16 DIAGNOSIS — J339 Nasal polyp, unspecified: Secondary | ICD-10-CM | POA: Diagnosis not present

## 2021-10-16 NOTE — Progress Notes (Unsigned)
[place] - [place] - [place] - [place] - [place]   Follow-up Note  Referring Provider: Hadley Pen, MD Primary Provider: Hadley Pen, MD Date of Office Visit: 10/16/2021  Subjective:   Nicolas Willis (DOB: 02-17-1966) is a 56 y.o. male who [facility] 10[facility]16/2021 in re-evaluation of the following:  HPI: Nicolas Willis returns to this clinic in reevaluation of asthma, allergic rhinitis, nasal polyposis and LPR.  His last visit to this clinic was 15 August 2021.  We are working through a rather significant exacerbation of his respiratory tract disease that occurred in the mid part of the spring.  At this point he is really doing much better and he has been had very little need to use a short acting bronchodilator other than when it is very hot and the air quality is bad.  He has had very little problems with his nose.  He continues to use mepolizumab injections and Nasonex and montelukast and Breo.  When he was last seen in this clinic he was having a lot of throat clearing and a little bit of a raspy cough and we had him increase his Protonix to twice a day from once a day and that has resulted in resolution of this issue.  Allergies as of 10/16/2021   No Known Allergies      Medication List    acetaminophen 325 MG tablet Commonly known as: TYLENOL Take 2 tablets (650 mg total) by mouth every 6 (six) hours as needed for mild pain or headache (fever >/= 101).   albuterol 108 (90 Base) MCG/ACT inhaler Commonly known as: Proventil HFA Can inhale two puffs every four to six hours as needed for cough or wheeze.   amLODipine 5 MG tablet Commonly known as: NORVASC Take 5 mg by mouth at bedtime.   Breztri Aerosphere 160-9-4.8 MCG/ACT Aero Generic drug: Budeson-Glycopyrrol-Formoterol Inhale two puffs twice daily to prevent cough or wheeze.  Rinse, gargle, and spit after use.   fexofenadine 180 MG tablet Commonly known as: ALLEGRA Take  180 mg by mouth daily.   meloxicam 15 MG tablet Commonly known as: MOBIC Take 15 mg by mouth daily.   mometasone 50 MCG/ACT nasal spray Commonly known as: Nasonex Use one spray in each nostril once or twice daily   montelukast 10 MG tablet Commonly known as: SINGULAIR Take by mouth.   Nucala 100 MG/ML Soaj Generic drug: Mepolizumab Inject 1 mL (100 mg total) into the skin every 28 (twenty-eight) days.   pantoprazole 40 MG tablet Commonly known as: PROTONIX Take one tablet by mouth twice daily as directed.   sterile water (preservative free) injection USE AS DIRECTED    Past Medical History:  Diagnosis Date   Asthma    Frequent sinus infections     Past Surgical History:  Procedure Laterality Date   SINOSCOPY      Review of systems negative except as noted in HPI / PMHx or noted below:  Review of Systems  Constitutional: Negative.   HENT: Negative.    Eyes: Negative.   Respiratory: Negative.    Cardiovascular: Negative.   Gastrointestinal: Negative.   Genitourinary: Negative.   Musculoskeletal: Negative.   Skin: Negative.   Neurological: Negative.   Endo/Heme/Allergies: Negative.   Psychiatric/Behavioral: Negative.       Objective:   Vitals:   10/16/21 1017  BP: 138/88  Pulse: 80  Resp: 20  Temp: 98.2 F (36.8 C)  SpO2: 97%  Physical Exam Constitutional:      Appearance: He is not diaphoretic.  HENT:     Head: Normocephalic.     Right Ear: Tympanic membrane, ear canal and external ear normal.     Left Ear: Tympanic membrane, ear canal and external ear normal.     Nose: Nose normal. No mucosal edema or rhinorrhea.     Mouth/Throat:     Pharynx: Uvula midline. No oropharyngeal exudate.  Eyes:     Conjunctiva/sclera: Conjunctivae normal.  Neck:     Thyroid: No thyromegaly.     Trachea: Trachea normal. No tracheal tenderness or tracheal deviation.  Cardiovascular:     Rate and Rhythm: Normal rate and regular rhythm.     Heart  sounds: Normal heart sounds, S1 normal and S2 normal. No murmur heard. Pulmonary:     Effort: No respiratory distress.     Breath sounds: Normal breath sounds. No stridor. No wheezing or rales.  Lymphadenopathy:     Head:     Right side of head: No tonsillar adenopathy.     Left side of head: No tonsillar adenopathy.     Cervical: No cervical adenopathy.  Skin:    Findings: No erythema or rash.     Nails: There is no clubbing.  Neurological:     Mental Status: He is alert.     Diagnostics:    Spirometry was performed and demonstrated an FEV1 of 3.63 at 95 % of predicted.  Assessment and Plan:   1. Asthma, severe persistent, well-controlled   2. Other allergic rhinitis   3. Nasal polyposis   4. LPRD (laryngopharyngeal reflux disease)      1. Continue to perform Allergen avoidance measures as best as possible  2. Continue to Treat and prevent inflammation:   A. Nasonex - 1 spray each nostril Daily  B. montelukast 10 mg - 1 tablet Daily  C. Breztri - 2 inhalations 2 times per day (empty lungs)  D. Mepolizumab injections   3. Treat and prevent reflux / LPR:   A. Protonix 40 mg - 1 tablet 2 times per day  4. If needed:   A. Proventil HFA 2 puffs every 4-6 hours  B. loratadine 10 mg one tablet one time per day  C. nasal saline wash  5. Return to clinic in 6 months or earlier if needed  6. Obtain fall flu vaccine  Nicolas Willis is doing very well and we are going to keep him on a large collection of anti-inflammatory agents for his airway including use of mepolizumab and continue to have him use Protonix twice a day to address his LPR.  Assuming he does well with this plan I will see him back in this clinic in 6 months or earlier if there is a problem.    Laurette Schimke, MD Allergy / Immunology [facility]

## 2021-10-16 NOTE — Patient Instructions (Signed)
    1. Continue to perform Allergen avoidance measures as best as possible  2. Continue to Treat and prevent inflammation:   A. Nasonex - 1 spray each nostril Daily  B. montelukast 10 mg - 1 tablet Daily  C. Breztri - 2 inhalations 2 times per day (empty lungs)  D. Mepolizumab injections   3. Treat and prevent reflux / LPR:   A. Protonix 40 mg - 1 tablet 2 times per day  4. If needed:   A. Proventil HFA 2 puffs every 4-6 hours  B. loratadine 10 mg one tablet one time per day  C. nasal saline wash  5. Return to clinic in 6 months or earlier if needed  6. Obtain fall flu vaccine

## 2021-10-17 ENCOUNTER — Encounter: Payer: Self-pay | Admitting: Allergy and Immunology

## 2021-10-22 ENCOUNTER — Ambulatory Visit (INDEPENDENT_AMBULATORY_CARE_PROVIDER_SITE_OTHER): Payer: BC Managed Care – PPO | Admitting: *Deleted

## 2021-10-22 DIAGNOSIS — J4550 Severe persistent asthma, uncomplicated: Secondary | ICD-10-CM | POA: Diagnosis not present

## 2021-10-23 ENCOUNTER — Ambulatory Visit: Payer: BC Managed Care – PPO

## 2021-11-20 ENCOUNTER — Ambulatory Visit: Payer: BC Managed Care – PPO

## 2022-04-10 NOTE — Progress Notes (Signed)
[facility] [place] Phone: [phone] Subjective:    I'm seeing this patient by the request  of:  Myrlene Broker, MD  CC: bilateral shoulder pain   [id number]  Nicolas Willis is a 57 y.o. male coming in with complaint of B shoulder pain. Last seen one year ago for knee pain. Patient states waking up at night affecting some daily activities as well.  Patient has noted some of the exercises at this moment.  He has decreased weight with some of his lifting.  Patient rates the severity of pain is 8 out of 10.       Past Medical History:  Diagnosis Date   Asthma    Frequent sinus infections    Past Surgical History:  Procedure Laterality Date   SINOSCOPY     Social History   Socioeconomic History   Marital status: Married    Spouse name: Not on file   Number of children: Not on file   Years of education: Not on file   Highest education level: Not on file  Occupational History   Not on file  Tobacco Use   Smoking status: Never   Smokeless tobacco: Never  Vaping Use   Vaping Use: Never used  Substance and Sexual Activity   Alcohol use: No   Drug use: No   Sexual activity: Not on file  Other Topics Concern   Not on file  Social History Narrative   Not on file   Social Determinants of Health   Financial Resource Strain: Not on file  Food Insecurity: Not on file  Transportation Needs: Not on file  Physical Activity: Not on file  Stress: Not on file  Social Connections: Not on file   No Known Allergies Family History  Problem Relation Age of Onset   Leukemia Mother    Heart disease Maternal Grandmother    Heart disease Maternal Grandfather       Current Outpatient Medications (Cardiovascular):    amLODipine (NORVASC) 5 MG tablet, Take 5 mg by mouth at bedtime.    Current Outpatient Medications (Respiratory):    albuterol (PROVENTIL HFA) 108 (90 Base) MCG/ACT inhaler, Can inhale two  puffs every four to six hours as needed for cough or wheeze.   Budeson-Glycopyrrol-Formoterol (BREZTRI AEROSPHERE) 160-9-4.8 MCG/ACT AERO, Inhale two puffs twice daily to prevent cough or wheeze.  Rinse, gargle, and spit after use.   fexofenadine (ALLEGRA) 180 MG tablet, Take 180 mg by mouth daily.   Mepolizumab (NUCALA) 100 MG/ML SOAJ, Inject 1 mL (100 mg total) into the skin every 28 (twenty-eight) days.   mometasone (NASONEX) 50 MCG/ACT nasal spray, Use one spray in each nostril once or twice daily (Patient taking differently: Place 1 spray into the nose at bedtime.)   montelukast (SINGULAIR) 10 MG tablet, Take by mouth.  Current Facility-Administered Medications (Respiratory):    Mepolizumab SOLR 100 mg  Current Outpatient Medications (Analgesics):    acetaminophen (TYLENOL) 325 MG tablet, Take 2 tablets (650 mg total) by mouth every 6 (six) hours as needed for mild pain or headache (fever >/= 101).   meloxicam (MOBIC) 15 MG tablet, Take 15 mg by mouth daily.     Current Outpatient Medications (Other):    pantoprazole (PROTONIX) 40 MG tablet, Take one tablet by mouth twice daily as directed.   Water For Injection Sterile (STERILE WATER, PRESERVATIVE FREE,) injection, USE AS DIRECTED    Reviewed prior external information including notes and  imaging from  primary care provider As well as notes that were available from care everywhere and other healthcare systems.  Past medical history, social, surgical and family history all reviewed in electronic medical record.  No pertanent information unless stated regarding to the chief complaint.   Review of Systems:  No headache, visual changes, nausea, vomiting, diarrhea, constipation, dizziness, abdominal pain, skin rash, fevers, chills, night sweats, weight loss, swollen lymph nodes, body aches, joint swelling, chest pain, shortness of breath, mood changes. POSITIVE muscle aches  Objective  Height 5\' 9"  (1.753 m), weight 224 lb (101.6  kg).   General: No apparent distress alert and oriented x3 mood and affect normal, dressed appropriately.  HEENT: Pupils equal, extraocular movements intact  Respiratory: Patient's speak in full sentences and does not appear short of breath  Cardiovascular: No lower extremity edema, non tender, no erythema  Shoulder exam shows patient does have positive crossover noted.  Rotator cuff does show positive impingement noted.  5 out of 5 strength of the rotator cuff bilaterally  Limited muscular skeletal ultrasound was performed and interpreted by Hulan Saas, M  Limited ultrasound shows the patient does have some very mild hypoechoic changes of the subacromial but no significant tearing of the rotator cuff noted.  Patient does have moderate arthritic changes of the acromioclavicular joint with hypoechoic changes significant with effusion. Impression: Bilateral acromioclavicular arthritis with effusion  Procedure: Real-time Ultrasound Guided Injection of left acromioclavicular joint Device: GE Logiq Q7 Ultrasound guided injection is preferred based studies that show increased duration, increased effect, greater accuracy, decreased procedural pain, increased response rate, and decreased cost with ultrasound guided versus blind injection.  Verbal informed consent obtained.  Time-out conducted.  Noted no overlying erythema, induration, or other signs of local infection.  Skin prepped in a sterile fashion.  Local anesthesia: Topical Ethyl chloride.  With sterile technique and under real time ultrasound guidance: With a 25-gauge half inch needle injected with 0.5 cc of 0.5% Marcaine and 0.5 cc of Kenalog 40 mg/mL Completed without difficulty  Pain immediately resolved suggesting accurate placement of the medication.  Advised to call if fevers/chills, erythema, induration, drainage, or persistent bleeding.  Impression: Technically successful ultrasound guided injection.  Procedure: Real-time  Ultrasound Guided Injection of right acromioclavicular joint Device: GE Logiq Q7 Ultrasound guided injection is preferred based studies that show increased duration, increased effect, greater accuracy, decreased procedural pain, increased response rate, and decreased cost with ultrasound guided versus blind injection.  Verbal informed consent obtained.  Time-out conducted.  Noted no overlying erythema, induration, or other signs of local infection.  Skin prepped in a sterile fashion.  Local anesthesia: Topical Ethyl chloride.  With sterile technique and under real time ultrasound guidance: With a 25-gauge half inch needle injected 0.5 cc of 0.5% Marcaine and 0.5 cc of Kenalog 40 mg/mL Completed without difficulty  Pain immediately resolved suggesting accurate placement of the medication.  Advised to call if fevers/chills, erythema, induration, drainage, or persistent bleeding.  Impression: Technically successful ultrasound guided injection.  [id number]; 15 additional minutes spent for Therapeutic exercises as stated in above notes.  This included exercises focusing on stretching, strengthening, with significant focus on eccentric aspects.   Long term goals include an improvement in range of motion, strength, endurance as well as avoiding reinjury. Patient's frequency would include in 1-2 times a day, 3-5 times a week for a duration of 6-12 weeks. Shoulder Exercises that included:  Basic scapular stabilization to include adduction and depression of scapula Scaption, focusing  on proper movement and good control Internal and External rotation utilizing a theraband, with elbow tucked at side entire time Rows with theraband   Proper technique shown and discussed handout in great detail with ATC.  All questions were discussed and answered.      Impression and Recommendations:     The above documentation has been reviewed and is accurate and complete Judi Saa, DO

## 2022-04-14 ENCOUNTER — Ambulatory Visit: Payer: BC Managed Care – PPO | Admitting: Family Medicine

## 2022-04-14 ENCOUNTER — Ambulatory Visit: Payer: Self-pay

## 2022-04-14 VITALS — Ht 69.0 in | Wt 224.0 lb

## 2022-04-14 DIAGNOSIS — M19011 Primary osteoarthritis, right shoulder: Secondary | ICD-10-CM | POA: Diagnosis not present

## 2022-04-14 DIAGNOSIS — M19012 Primary osteoarthritis, left shoulder: Secondary | ICD-10-CM

## 2022-04-14 DIAGNOSIS — M25511 Pain in right shoulder: Secondary | ICD-10-CM

## 2022-04-14 DIAGNOSIS — M25512 Pain in left shoulder: Secondary | ICD-10-CM | POA: Diagnosis not present

## 2022-04-14 NOTE — Assessment & Plan Note (Signed)
Bilateral injections given today, tolerated the procedure well, discussed icing regimen and home exercises, discussed which activities to do and which ones to avoid, increase to be slowly.  Keep hands within peripheral vision.  Differential includes cervical radiculopathy so we will get x-rays to further evaluate as well.  Follow-up with me again in 6 to 8 weeks.  Home exercises given with athletic trainer as well today.

## 2022-04-14 NOTE — Patient Instructions (Signed)
Injection in both shoulder Do prescribed exercises at least 3x a week  See you again in 6-8 weeks

## 2022-05-18 ENCOUNTER — Other Ambulatory Visit: Payer: Self-pay | Admitting: Allergy and Immunology

## 2022-05-29 NOTE — Progress Notes (Signed)
[place] Phone: [phone] Subjective:   Fontaine No, am serving as a scribe for Dr. Hulan Saas.  I'm seeing this patient by the request  of:  Myrlene Broker, MD  CC: Bilateral shoulder pain  [id number]  04/14/2022 Bilateral injections given today, tolerated the procedure well, discussed icing regimen and home exercises, discussed which activities to do and which ones to avoid, increase to be slowly.  Keep hands within peripheral vision.  Differential includes cervical radiculopathy so we will get x-rays to further evaluate as well.  Follow-up with me again in 6 to 8 weeks.  Home exercises given with athletic trainer as well today.      Update 06/02/2022 Gurjit Signor is a 57 y.o. male coming in with complaint of B shoulder pain.  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  Given injections 2 months ago.  Patient states that the injections did help. L shoulder is worse than right.   On Sunday patient woke up and stepped out of bed but felt a pop in R knee. Patient unable to work due to pain. Pain throughout entire joint. Notes more movement when he walks.      Past Medical History:  Diagnosis Date   Asthma    Frequent sinus infections    Past Surgical History:  Procedure Laterality Date   SINOSCOPY     Social History   Socioeconomic History   Marital status: Married    Spouse name: Not on file   Number of children: Not on file   Years of education: Not on file   Highest education level: Not on file  Occupational History   Not on file  Tobacco Use   Smoking status: Never   Smokeless tobacco: Never  Vaping Use   Vaping Use: Never used  Substance and Sexual Activity   Alcohol use: No   Drug use: No   Sexual activity: Not on file  Other Topics Concern   Not on file  Social History Narrative   Not on file   Social Determinants of Health   Financial Resource Strain: Not on file   Food Insecurity: Not on file  Transportation Needs: Not on file  Physical Activity: Not on file  Stress: Not on file  Social Connections: Not on file   No Known Allergies Family History  Problem Relation Age of Onset   Leukemia Mother    Heart disease Maternal Grandmother    Heart disease Maternal Grandfather       Current Outpatient Medications (Cardiovascular):    amLODipine (NORVASC) 5 MG tablet, Take 5 mg by mouth at bedtime.    Current Outpatient Medications (Respiratory):    albuterol (PROVENTIL HFA) 108 (90 Base) MCG/ACT inhaler, Can inhale two puffs every four to six hours as needed for cough or wheeze.   Budeson-Glycopyrrol-Formoterol (BREZTRI AEROSPHERE) 160-9-4.8 MCG/ACT AERO, Inhale two puffs twice daily to prevent cough or wheeze.  Rinse, gargle, and spit after use.   fexofenadine (ALLEGRA) 180 MG tablet, Take 180 mg by mouth daily.   Mepolizumab (NUCALA) 100 MG/ML SOAJ, Inject 1 mL (100 mg total) into the skin every 28 (twenty-eight) days.   mometasone (NASONEX) 50 MCG/ACT nasal spray, Use one spray in each nostril once or twice daily (Patient taking differently: Place 1 spray into the nose at bedtime.)   montelukast (SINGULAIR) 10 MG tablet, Take by mouth.  Current Facility-Administered Medications (Respiratory):    Mepolizumab SOLR 100 mg  Current Outpatient Medications (Analgesics):    acetaminophen (TYLENOL) 325 MG tablet, Take 2 tablets (650 mg total) by mouth every 6 (six) hours as needed for mild pain or headache (fever >/= 101).   meloxicam (MOBIC) 15 MG tablet, Take 15 mg by mouth daily.     Current Outpatient Medications (Other):    pantoprazole (PROTONIX) 40 MG tablet, Take one tablet by mouth twice daily as directed.   Water For Injection Sterile (STERILE WATER, PRESERVATIVE FREE,) injection, USE AS DIRECTED    Reviewed prior external information including notes and imaging from  primary care provider As well as notes that were available from  care everywhere and other healthcare systems.  Past medical history, social, surgical and family history all reviewed in electronic medical record.  No pertanent information unless stated regarding to the chief complaint.   Review of Systems:  No headache, visual changes, nausea, vomiting, diarrhea, constipation, dizziness, abdominal pain, skin rash, fevers, chills, night sweats, weight loss, swollen lymph nodes, body aches, joint swelling, chest pain, shortness of breath, mood changes. POSITIVE muscle aches  Objective  There were no vitals taken for this visit.   General: No apparent distress alert and oriented x3 mood and affect normal, dressed appropriately.  HEENT: Pupils equal, extraocular movements intact  Respiratory: Patient's speak in full sentences and does not appear short of breath  Cardiovascular: No lower extremity edema, non tender, no erythema  Shoulder exam shows right shoulder seems to be doing fairly well. Left shoulder seems to be causing mild impingement. Negative crossover though.  Right knee exam shows the patient does have an effusion noted.  Positive McMurray's noted.  Mild tenderness over the medial aspect of the knee.  Procedure: Real-time Ultrasound Guided Injection of left glenohumeral joint Device: GE Logiq E  Ultrasound guided injection is preferred based studies that show increased duration, increased effect, greater accuracy, decreased procedural pain, increased response rate with ultrasound guided versus blind injection.  Verbal informed consent obtained.  Time-out conducted.  Noted no overlying erythema, induration, or other signs of local infection.  Skin prepped in a sterile fashion.  Local anesthesia: Topical Ethyl chloride.  With sterile technique and under real time ultrasound guidance:  Joint visualized.  21g 2 inch needle inserted posterior approach. Pictures taken for needle placement. Patient did have injection of 2 cc of 0.5% Marcaine, and 1cc  of Kenalog 40 mg/dL. Completed without difficulty  Pain immediately resolved suggesting accurate placement of the medication.  Advised to call if fevers/chills, erythema, induration, drainage, or persistent bleeding.  Impression: Technically successful ultrasound guided injection.  After informed written and verbal consent, patient was seated on exam table. Right knee was prepped with alcohol swab and utilizing anterolateral approach, patient's right knee space was injected with 4:1  marcaine 0.5%: Kenalog 40mg /dL. Patient tolerated the procedure well without immediate complications.    Impression and Recommendations:     The above documentation has been reviewed and is accurate and complete Lyndal Pulley, DO

## 2022-06-03 ENCOUNTER — Ambulatory Visit: Payer: BC Managed Care – PPO | Admitting: Family Medicine

## 2022-06-03 ENCOUNTER — Other Ambulatory Visit: Payer: Self-pay

## 2022-06-03 ENCOUNTER — Encounter: Payer: Self-pay | Admitting: Family Medicine

## 2022-06-03 VITALS — BP 136/96 | HR 75 | Ht 69.0 in | Wt 225.0 lb

## 2022-06-03 DIAGNOSIS — M7552 Bursitis of left shoulder: Secondary | ICD-10-CM | POA: Insufficient documentation

## 2022-06-03 DIAGNOSIS — S83241D Other tear of medial meniscus, current injury, right knee, subsequent encounter: Secondary | ICD-10-CM

## 2022-06-03 DIAGNOSIS — M19012 Primary osteoarthritis, left shoulder: Secondary | ICD-10-CM

## 2022-06-03 DIAGNOSIS — G8929 Other chronic pain: Secondary | ICD-10-CM | POA: Diagnosis not present

## 2022-06-03 DIAGNOSIS — M25512 Pain in left shoulder: Secondary | ICD-10-CM

## 2022-06-03 DIAGNOSIS — M19011 Primary osteoarthritis, right shoulder: Secondary | ICD-10-CM | POA: Diagnosis not present

## 2022-06-03 DIAGNOSIS — M25511 Pain in right shoulder: Secondary | ICD-10-CM

## 2022-06-03 NOTE — Assessment & Plan Note (Signed)
Patient given injection and tolerated the procedure well.  Never did have surgical intervention for the meniscus.  Concern for the underlying arthritic changes noted on MRI as well.  Will get viscosupplementation approved to see if this.  Possible necessary as well.  Follow-up with me in 6 to 8 weeks

## 2022-06-03 NOTE — Assessment & Plan Note (Signed)
Chronic problem with exacerbation.  Discussed icing regimen and home exercises, discussed which activities to do and which ones to avoid, increase activity slowly.  Follow-up again in 6 to 8 weeks

## 2022-06-03 NOTE — Patient Instructions (Signed)
Good to see you.  Ice 20 minutes 2 times daily. Usually after activity and before bed. Injected the right knee and the left shoulder today  Should help a lot No large changes just try to be smart  Ice 20 minutes 2 times daily. Usually after activity and before bed. See me again in 2 months

## 2022-06-03 NOTE — Assessment & Plan Note (Signed)
Patient likely had more reactive bursitis secondary to patient compensating for the acromioclavicular arthritis.  As severe as what it was previously regular, and the ultrasound when it comes to the acromioclavicular arthritis.  Follow-up with me again.  Given home exercises, injection follow-up again in 6 to 8 weeks

## 2022-06-26 ENCOUNTER — Other Ambulatory Visit: Payer: Self-pay | Admitting: Allergy and Immunology

## 2022-06-30 ENCOUNTER — Ambulatory Visit (INDEPENDENT_AMBULATORY_CARE_PROVIDER_SITE_OTHER): Payer: BC Managed Care – PPO | Admitting: Allergy and Immunology

## 2022-06-30 ENCOUNTER — Encounter: Payer: Self-pay | Admitting: Allergy and Immunology

## 2022-06-30 VITALS — BP 136/86 | HR 96 | Resp 14 | Ht 69.5 in | Wt 221.8 lb

## 2022-06-30 DIAGNOSIS — J339 Nasal polyp, unspecified: Secondary | ICD-10-CM | POA: Diagnosis not present

## 2022-06-30 DIAGNOSIS — J4550 Severe persistent asthma, uncomplicated: Secondary | ICD-10-CM | POA: Diagnosis not present

## 2022-06-30 DIAGNOSIS — K219 Gastro-esophageal reflux disease without esophagitis: Secondary | ICD-10-CM | POA: Diagnosis not present

## 2022-06-30 DIAGNOSIS — J3089 Other allergic rhinitis: Secondary | ICD-10-CM

## 2022-06-30 MED ORDER — BREZTRI AEROSPHERE 160-9-4.8 MCG/ACT IN AERO: INHALATION_SPRAY | RESPIRATORY_TRACT | 5 refills | Status: DC

## 2022-06-30 NOTE — Progress Notes (Unsigned)
[facility] [place] [facility] [place] [facility] [place] [facility] [place]   Follow-up Note  Referring Provider: Hadley Pen, MD Primary Provider: Hadley Pen, MD Date of Office Visit: 06/30/2022  Subjective:   Nicolas Willis (DOB: 01-16-1966) is a 57 y.o. male who [facility] in re-evaluation of the following:  HPI: Nicolas Willis returns to this clinic in evaluation of asthma, allergic rhinitis, nasal polyposis, and LPR.  His last visit to this clinic was 16 October 2021.  He continues to do very well regarding his upper and lower airway issue while using Mepolizumab injections and also continuing to use Breztri and montelukast and Nasonex on a consistent basis.  He has not required a systemic steroid or antibiotic for any type of airway issue and he can exert himself to the extent that his musculoskeletal issues, specifically his knee, allows him to do so.  His reflux is under very good control while using Protonix.  Allergies as of 06/30/2022   No Known Allergies      Medication List    acetaminophen 325 MG tablet Commonly known as: TYLENOL Take 2 tablets (650 mg total) by mouth every 6 (six) hours as needed for mild pain or headache (fever >/= 101).   albuterol 108 (90 Base) MCG/ACT inhaler Commonly known as: Proventil HFA Can inhale two puffs every four to six hours as needed for cough or wheeze.   amLODipine 5 MG tablet Commonly known as: NORVASC Take 5 mg by mouth at bedtime.   Breztri Aerosphere 160-9-4.8 MCG/ACT Aero Generic drug: Budeson-Glycopyrrol-Formoterol Inhale two puffs twice daily to prevent cough or wheeze.  Rinse, gargle, and spit after use.   fexofenadine 180 MG tablet Commonly known as: ALLEGRA Take 180 mg by mouth daily.   meloxicam 15 MG tablet Commonly known as: MOBIC Take 15 mg by mouth daily.   mometasone 50 MCG/ACT nasal spray Commonly known as: Nasonex Use one spray in each nostril once or twice  daily   montelukast 10 MG tablet Commonly known as: SINGULAIR Take by mouth.   Nucala 100 MG/ML Soaj Generic drug: Mepolizumab Inject 1 mL (100 mg total) into the skin every 28 (twenty-eight) days.   pantoprazole 40 MG tablet Commonly known as: PROTONIX Take one tablet by mouth twice daily as directed.   sterile water (preservative free) injection USE AS DIRECTED    Past Medical History:  Diagnosis Date   Asthma    Frequent sinus infections     Past Surgical History:  Procedure Laterality Date   SINOSCOPY      Review of systems negative except as noted in HPI / PMHx or noted below:  Review of Systems  Constitutional: Negative.   HENT: Negative.    Eyes: Negative.   Respiratory: Negative.    Cardiovascular: Negative.   Gastrointestinal: Negative.   Genitourinary: Negative.   Musculoskeletal: Negative.   Skin: Negative.   Neurological: Negative.   Endo/Heme/Allergies: Negative.   Psychiatric/Behavioral: Negative.       Objective:   Vitals:   06/30/22 1647  BP: 136/86  Pulse: 96  Resp: 14  SpO2: 94%   Height: 5' 9.5" (176.5 cm)  Weight: 221 lb 12.8 oz (100.6 kg)   Physical Exam Constitutional:      Appearance: He is not diaphoretic.  HENT:     Head: Normocephalic.     Right Ear: Tympanic membrane, ear canal and external ear normal.     Left Ear: Tympanic membrane, ear canal  and external ear normal.     Nose: Nose normal. No mucosal edema or rhinorrhea.     Mouth/Throat:     Pharynx: Uvula midline. No oropharyngeal exudate.  Eyes:     Conjunctiva/sclera: Conjunctivae normal.  Neck:     Thyroid: No thyromegaly.     Trachea: Trachea normal. No tracheal tenderness or tracheal deviation.  Cardiovascular:     Rate and Rhythm: Normal rate and regular rhythm.     Heart sounds: Normal heart sounds, S1 normal and S2 normal. No murmur heard. Pulmonary:     Effort: No respiratory distress.     Breath sounds: Normal breath sounds. No stridor. No wheezing  or rales.  Lymphadenopathy:     Head:     Right side of head: No tonsillar adenopathy.     Left side of head: No tonsillar adenopathy.     Cervical: No cervical adenopathy.  Skin:    Findings: No erythema or rash.     Nails: There is no clubbing.  Neurological:     Mental Status: He is alert.     Diagnostics: Spirometry was performed and demonstrated an FEV1 of 3.53 at 97 % of predicted.  Assessment and Plan:   1. Asthma, severe persistent, well-controlled   2. Other allergic rhinitis   3. Nasal polyposis   4. LPRD (laryngopharyngeal reflux disease)     1. Continue to perform Allergen avoidance measures as best as possible  2. Continue to Treat and prevent inflammation:   A. Nasonex - 1 spray each nostril Daily  B. montelukast 10 mg - 1 tablet Daily  C. Breztri - 2 inhalations 2 times per day (empty lungs)  D. Mepolizumab injections   3. Treat and prevent reflux / LPR:   A. Protonix 40 mg - 1 tablet 1-2 times per day  4. If needed:   A. Proventil HFA 2 puffs every 4-6 hours  B. loratadine 10 mg one tablet one time per day  C. nasal saline wash  5. 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  His reflux also appears to be under very good control and he can see if he does just as well while using Protonix once a day instead of twice a day.  I will see him back in this clinic in 6 months or earlier if there is a problem.  Laurette Schimke, MD Allergy / Immunology  [facility]

## 2022-06-30 NOTE — Patient Instructions (Addendum)
    1. Continue to perform Allergen avoidance measures as best as possible  2. Continue to Treat and prevent inflammation:   A. Nasonex - 1 spray each nostril Daily  B. montelukast 10 mg - 1 tablet Daily  C. Breztri - 2 inhalations 2 times per day (empty lungs)  D. Mepolizumab injections   3. Treat and prevent reflux / LPR:   A. Protonix 40 mg - 1 tablet 1-2 times per day  4. If needed:   A. Proventil HFA 2 puffs every 4-6 hours  B. loratadine 10 mg one tablet one time per day  C. nasal saline wash  5. Return to clinic in 6 months or earlier if needed

## 2022-07-01 ENCOUNTER — Encounter: Payer: Self-pay | Admitting: Allergy and Immunology

## 2022-07-29 NOTE — Progress Notes (Deleted)
[facility] [address] [place] [address] Phone: [phone] Subjective:    I'm seeing this patient by the request  of:  Hadley Pen, MD  CC:   [id number]  06/03/2022 Chronic problem with exacerbation.  Discussed icing regimen and home exercises, discussed which activities to do and which ones to avoid, increase activity slowly.  Follow-up again in 6 to 8 weeks      Patient likely had more reactive bursitis secondary to patient compensating for the acromioclavicular arthritis.  As severe as what it was previously regular, and the ultrasound when it comes to the acromioclavicular arthritis.  Follow-up with me again.  Given home exercises, injection follow-up again in 6 to 8 weeks      Patient given injection and tolerated the procedure well.  Never did have surgical intervention for the meniscus.  Concern for the underlying arthritic changes noted on MRI as well.  Will get viscosupplementation approved to see if this.  Possible necessary as well.  Follow-up with me in 6 to 8 weeks     Update 08/05/2022 Nicolas Willis is a 57 y.o. male coming in with complaint of B shoulder and R knee pain. Patient states      Past Medical History:  Diagnosis Date   Asthma    Frequent sinus infections    Past Surgical History:  Procedure Laterality Date   SINOSCOPY     Social History   Socioeconomic History   Marital status: Married    Spouse name: Not on file   Number of children: Not on file   Years of education: Not on file   Highest education level: Not on file  Occupational History   Not on file  Tobacco Use   Smoking status: Never   Smokeless tobacco: Never  Vaping Use   Vaping Use: Never used  Substance and Sexual Activity   Alcohol use: No   Drug use: No   Sexual activity: Not on file  Other Topics Concern   Not on file  Social History Narrative   Not on file   Social Determinants of Health   Financial Resource Strain:  Not on file  Food Insecurity: Not on file  Transportation Needs: Not on file  Physical Activity: Not on file  Stress: Not on file  Social Connections: Not on file   No Known Allergies Family History  Problem Relation Age of Onset   Leukemia Mother    Heart disease Maternal Grandmother    Heart disease Maternal Grandfather       Current Outpatient Medications (Cardiovascular):    amLODipine (NORVASC) 5 MG tablet, Take 5 mg by mouth at bedtime.    Current Outpatient Medications (Respiratory):    albuterol (PROVENTIL HFA) 108 (90 Base) MCG/ACT inhaler, Can inhale two puffs every four to six hours as needed for cough or wheeze.   Budeson-Glycopyrrol-Formoterol (BREZTRI AEROSPHERE) 160-9-4.8 MCG/ACT AERO, Inhale two puffs twice daily to prevent cough or wheeze.  Rinse, gargle, and spit after use.   fexofenadine (ALLEGRA) 180 MG tablet, Take 180 mg by mouth daily.   Mepolizumab (NUCALA) 100 MG/ML SOAJ, Inject 1 mL (100 mg total) into the skin every 28 (twenty-eight) days.   mometasone (NASONEX) 50 MCG/ACT nasal spray, Use one spray in each nostril once or twice daily (Patient taking differently: Place 1 spray into the nose at bedtime.)   montelukast (SINGULAIR) 10 MG tablet, Take by mouth.  Current Facility-Administered Medications (Respiratory):    Mepolizumab SOLR  100 mg  Current Outpatient Medications (Analgesics):    acetaminophen (TYLENOL) 325 MG tablet, Take 2 tablets (650 mg total) by mouth every 6 (six) hours as needed for mild pain or headache (fever >/= 101).   meloxicam (MOBIC) 15 MG tablet, Take 15 mg by mouth daily.     Current Outpatient Medications (Other):    pantoprazole (PROTONIX) 40 MG tablet, Take one tablet by mouth twice daily as directed.   Water For Injection Sterile (STERILE WATER, PRESERVATIVE FREE,) injection, USE AS DIRECTED    Reviewed prior external information including notes and imaging from  primary care provider As well as notes that were  available from care everywhere and other healthcare systems.  Past medical history, social, surgical and family history all reviewed in electronic medical record.  No pertanent information unless stated regarding to the chief complaint.   Review of Systems:  No headache, visual changes, nausea, vomiting, diarrhea, constipation, dizziness, abdominal pain, skin rash, fevers, chills, night sweats, weight loss, swollen lymph nodes, body aches, joint swelling, chest pain, shortness of breath, mood changes. POSITIVE muscle aches  Objective  There were no vitals taken for this visit.   General: No apparent distress alert and oriented x3 mood and affect normal, dressed appropriately.  HEENT: Pupils equal, extraocular movements intact  Respiratory: Patient's speak in full sentences and does not appear short of breath  Cardiovascular: No lower extremity edema, non tender, no erythema      Impression and Recommendations:

## 2022-08-05 ENCOUNTER — Ambulatory Visit: Payer: Managed Care, Other (non HMO) | Admitting: Family Medicine

## 2022-08-19 NOTE — Progress Notes (Unsigned)
Nicolas Willis [address] [place] [address] Phone: [phone] Subjective:   Nicolas Willis, am serving as a scribe for Dr. Antoine Willis.  I'm seeing this patient by the request  of:  Nicolas Pen, MD  CC: Bilateral shoulder pain  [id number]  06/03/2022 Chronic problem with exacerbation. Discussed icing regimen and home exercises, discussed which activities to do and which ones to avoid, increase activity slowly. Follow-up again in 6 to 8 weeks    Patient likely had more reactive bursitis secondary to patient compensating for the acromioclavicular arthritis.  As severe as what it was previously regular, and the ultrasound when it comes to the acromioclavicular arthritis.  Follow-up with me again.  Given home exercises, injection follow-up again in 6 to 8 weeks      08/20/2022 Nicolas Willis is a 57 y.o. male coming in with complaint of B shoulder pain.  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  Bilateral AC joint injections given in January.  Bursa sac injected on the left shoulder in March.  Patient states that his L shoulder is bothering him more than the right. Unable to sleep due to pain in anterior deltoid on L side. Unable to drive without pain. R shoulder is also painful but not as much as the L.        Past Medical History:  Diagnosis Date   Asthma    Frequent sinus infections    Past Surgical History:  Procedure Laterality Date   SINOSCOPY     Social History   Socioeconomic History   Marital status: Married    Spouse name: Not on file   Number of children: Not on file   Years of education: Not on file   Highest education level: Not on file  Occupational History   Not on file  Tobacco Use   Smoking status: Never   Smokeless tobacco: Never  Vaping Use   Vaping Use: Never used  Substance and Sexual Activity   Alcohol use: No   Drug  use: No   Sexual activity: Not on file  Other Topics Concern   Not on file  Social History Narrative   Not on file   Social Determinants of Health   Financial Resource Strain: Not on file  Food Insecurity: Not on file  Transportation Needs: Not on file  Physical Activity: Not on file  Stress: Not on file  Social Connections: Not on file   No Known Allergies Family History  Problem Relation Age of Onset   Leukemia Mother    Heart disease Maternal Grandmother    Heart disease Maternal Grandfather       Current Outpatient Medications (Cardiovascular):    amLODipine (NORVASC) 5 MG tablet, Take 5 mg by mouth at bedtime.    Current Outpatient Medications (Respiratory):    albuterol (PROVENTIL HFA) 108 (90 Base) MCG/ACT inhaler, Can inhale two puffs every four to six hours as needed for cough or wheeze.   Budeson-Glycopyrrol-Formoterol (BREZTRI AEROSPHERE) 160-9-4.8 MCG/ACT AERO, Inhale two puffs twice daily to prevent cough or wheeze.  Rinse, gargle, and spit after use.   fexofenadine (ALLEGRA) 180 MG tablet, Take 180 mg by mouth daily.   Mepolizumab (NUCALA) 100 MG/ML SOAJ, Inject 1 mL (100 mg total) into the skin every 28 (twenty-eight) days.   mometasone (NASONEX) 50 MCG/ACT nasal spray, Use one spray in each nostril once or twice daily (Patient  taking differently: Place 1 spray into the nose at bedtime.)   montelukast (SINGULAIR) 10 MG tablet, Take by mouth.  Current Facility-Administered Medications (Respiratory):    Mepolizumab SOLR 100 mg  Current Outpatient Medications (Analgesics):    acetaminophen (TYLENOL) 325 MG tablet, Take 2 tablets (650 mg total) by mouth every 6 (six) hours as needed for mild pain or headache (fever >/= 101).   meloxicam (MOBIC) 15 MG tablet, Take 15 mg by mouth daily.     Current Outpatient Medications (Other):    pantoprazole (PROTONIX) 40 MG tablet, Take one tablet by mouth twice daily as directed.   Water For Injection Sterile (STERILE  WATER, PRESERVATIVE FREE,) injection, USE AS DIRECTED    Reviewed prior external information including notes and imaging from  primary care provider As well as notes that were available from care everywhere and other healthcare systems.  Past medical history, social, surgical and family history all reviewed in electronic medical record.  No pertanent information unless stated regarding to the chief complaint.   Review of Systems:  No headache, visual changes, nausea, vomiting, diarrhea, constipation, dizziness, abdominal pain, skin rash, fevers, chills, night sweats, weight loss, swollen lymph nodes, body aches, joint swelling, chest pain, shortness of breath, mood changes. POSITIVE muscle aches  Objective  Blood pressure 120/86, pulse 73, height 5' 9.5" (1.765 m), weight 223 lb (101.2 kg), SpO2 98 %.   General: No apparent distress alert and oriented x3 mood and affect normal, dressed appropriately.  HEENT: Pupils equal, extraocular movements intact  Respiratory: Patient's speak in full sentences and does not appear short of breath  Cardiovascular: No lower extremity edema, non tender, no erythema  Bilateral shoulder exam show right shoulder has significant improvement in range of motion.  Still some tightness noted with crossover but nothing severe.  Patient's left shoulder though does have a positive O'Brien's, significant weakness with the O'Brien's at this point.  Still positive impingement with Neer and Hawkins but negative crossover actually with resistance.   Limited muscular skeletal ultrasound was performed and interpreted by Nicolas Willis, M significant decrease in the hypoechoic changes that were in the subacromial area previously.  Patient's acromioclavicular joint has a mild to moderate arthritic changes but no significant hypoechoic changes.  Patient does have some calcific changes noted over the posterior labrum but no significant effusion noted of the glenohumeral  joint. Impression: Improvement in the bursitis but questionable posterior labral pathology    Impression and Recommendations:    The above documentation has been reviewed and is accurate and complete Nicolas Saa, DO

## 2022-08-20 ENCOUNTER — Encounter: Payer: Self-pay | Admitting: Family Medicine

## 2022-08-20 ENCOUNTER — Ambulatory Visit (INDEPENDENT_AMBULATORY_CARE_PROVIDER_SITE_OTHER): Payer: BC Managed Care – PPO

## 2022-08-20 ENCOUNTER — Ambulatory Visit (INDEPENDENT_AMBULATORY_CARE_PROVIDER_SITE_OTHER): Payer: Managed Care, Other (non HMO) | Admitting: Family Medicine

## 2022-08-20 ENCOUNTER — Other Ambulatory Visit: Payer: Self-pay

## 2022-08-20 VITALS — BP 120/86 | HR 73 | Ht 69.5 in | Wt 223.0 lb

## 2022-08-20 DIAGNOSIS — G8929 Other chronic pain: Secondary | ICD-10-CM | POA: Diagnosis not present

## 2022-08-20 DIAGNOSIS — M7552 Bursitis of left shoulder: Secondary | ICD-10-CM

## 2022-08-20 DIAGNOSIS — M25512 Pain in left shoulder: Secondary | ICD-10-CM

## 2022-08-20 DIAGNOSIS — M25511 Pain in right shoulder: Secondary | ICD-10-CM | POA: Diagnosis not present

## 2022-08-20 NOTE — Assessment & Plan Note (Signed)
Patient has now failed a an acromioclavicular, subacromial injections, home exercises and formal physical therapy.  Worsening weakness with O'Brien's and concern for potential labral pathology that could be missed on the ultrasound.  At this point we will get x-ray as well as MRI for further evaluation.  Discussed which activities to do and which ones to avoid.  Depending on the MRI or MR arthrogram we will figure out next treatment options.  Pain has been greater than 6 months

## 2022-08-20 NOTE — Patient Instructions (Addendum)
Xray today Mri L shoulder [id number] We will be in touch

## 2022-09-22 ENCOUNTER — Encounter: Payer: Self-pay | Admitting: Family Medicine

## 2022-09-23 ENCOUNTER — Ambulatory Visit
Admission: RE | Admit: 2022-09-23 | Discharge: 2022-09-23 | Disposition: A | Discharge status: Home / Self Care | Payer: BC Managed Care – PPO | Source: Ambulatory Visit | Attending: Family Medicine | Admitting: Family Medicine

## 2022-09-23 DIAGNOSIS — G8929 Other chronic pain: Secondary | ICD-10-CM

## 2022-09-23 MED ORDER — IOPAMIDOL (ISOVUE-M 200) INJECTION 41%
13.0000 mL | Freq: Once | INTRAMUSCULAR | Status: AC
  Administered 2022-09-23: 13 mL via INTRA_ARTICULAR

## 2022-10-01 ENCOUNTER — Encounter: Payer: Self-pay | Admitting: Family Medicine

## 2022-10-27 NOTE — Progress Notes (Unsigned)
[name] [address] [place] [address] Phone: [phone] Subjective:   INadine Counts, am serving as a scribe for Dr. Antoine Primas.  I'm seeing this patient by the request  of:  Hadley Pen, MD  CC: Left shoulder pain follow-up  [id number]  08/20/2022 Patient has now failed a an acromioclavicular, subacromial injections, home exercises and formal physical therapy.  Worsening weakness with O'Brien's and concern for potential labral pathology that could be missed on the ultrasound.  At this point we will get x-ray as well as MRI for further evaluation.  Discussed which activities to do and which ones to avoid.  Depending on the MRI or MR arthrogram we will figure out next treatment options.  Pain has been greater than 6 months    Update 10/28/2022 Chun Daney is a 57 y.o. male coming in with complaint of L shoulder pain. Patient states about the same. PRP first of the year. Wants steroid injection. Sometimes can't sleep because of the pain.       Past Medical History:  Diagnosis Date   Asthma    Frequent sinus infections    Past Surgical History:  Procedure Laterality Date   SINOSCOPY     Social History   Socioeconomic History   Marital status: Married    Spouse name: Not on file   Number of children: Not on file   Years of education: Not on file   Highest education level: Not on file  Occupational History   Not on file  Tobacco Use   Smoking status: Never   Smokeless tobacco: Never  Vaping Use   Vaping status: Never Used  Substance and Sexual Activity   Alcohol use: No   Drug use: No   Sexual activity: Not on file  Other Topics Concern   Not on file  Social History Narrative   Not on file   Social Determinants of Health   Financial Resource Strain: Not on file  Food Insecurity: Not on file  Transportation Needs: Not on file  Physical Activity: Not on file  Stress: Not on file  Social Connections: Not  on file   No Known Allergies Family History  Problem Relation Age of Onset   Leukemia Mother    Heart disease Maternal Grandmother    Heart disease Maternal Grandfather       Current Outpatient Medications (Cardiovascular):    amLODipine (NORVASC) 5 MG tablet, Take 5 mg by mouth at bedtime.    Current Outpatient Medications (Respiratory):    albuterol (PROVENTIL HFA) 108 (90 Base) MCG/ACT inhaler, Can inhale two puffs every four to six hours as needed for cough or wheeze.   Budeson-Glycopyrrol-Formoterol (BREZTRI AEROSPHERE) 160-9-4.8 MCG/ACT AERO, Inhale two puffs twice daily to prevent cough or wheeze.  Rinse, gargle, and spit after use.   fexofenadine (ALLEGRA) 180 MG tablet, Take 180 mg by mouth daily.   Mepolizumab (NUCALA) 100 MG/ML SOAJ, Inject 1 mL (100 mg total) into the skin every 28 (twenty-eight) days.   mometasone (NASONEX) 50 MCG/ACT nasal spray, Use one spray in each nostril once or twice daily (Patient taking differently: Place 1 spray into the nose at bedtime.)   montelukast (SINGULAIR) 10 MG tablet, Take by mouth.  Current Facility-Administered Medications (Respiratory):    Mepolizumab SOLR 100 mg  Current Outpatient Medications (Analgesics):    acetaminophen (TYLENOL) 325 MG tablet, Take 2 tablets (650 mg total) by mouth every 6 (six) hours as needed for mild pain  or headache (fever >/= 101).   meloxicam (MOBIC) 15 MG tablet, Take 15 mg by mouth daily.     Current Outpatient Medications (Other):    pantoprazole (PROTONIX) 40 MG tablet, Take one tablet by mouth twice daily as directed.   Water For Injection Sterile (STERILE WATER, PRESERVATIVE FREE,) injection, USE AS DIRECTED    Reviewed prior external information including notes and imaging from  primary care provider As well as notes that were available from care everywhere and other healthcare systems.  Past medical history, social, surgical and family history all reviewed in electronic medical  record.  No pertanent information unless stated regarding to the chief complaint.   Review of Systems:  No headache, visual changes, nausea, vomiting, diarrhea, constipation, dizziness, abdominal pain, skin rash, fevers, chills, night sweats, weight loss, swollen lymph nodes, body aches, joint swelling, chest pain, shortness of breath, mood changes. POSITIVE muscle aches  Objective  There were no vitals taken for this visit.   General: No apparent distress alert and oriented x3 mood and affect normal, dressed appropriately.  HEENT: Pupils equal, extraocular movements intact  Respiratory: Patient's speak in full sentences and does not appear short of breath  Cardiovascular: No lower extremity edema, non tender, no erythema  Left shoulder exam shows  Procedure: Real-time Ultrasound Guided Injection of left glenohumeral joint Device: GE Logiq E  Ultrasound guided injection is preferred based studies that show increased duration, increased effect, greater accuracy, decreased procedural pain, increased response rate with ultrasound guided versus blind injection.  Verbal informed consent obtained.  Time-out conducted.  Noted no overlying erythema, induration, or other signs of local infection.  Skin prepped in a sterile fashion.  Local anesthesia: Topical Ethyl chloride.  With sterile technique and under real time ultrasound guidance:  Joint visualized.  21g 2 inch needle inserted posterior approach. Pictures taken for needle placement. Patient did have injection of 2 cc of 0.5% Marcaine, and 1cc of Kenalog 40 mg/dL. Completed without difficulty  Pain immediately resolved suggesting accurate placement of the medication.  Advised to call if fevers/chills, erythema, induration, drainage, or persistent bleeding.  Impression: Technically successful ultrasound guided injection.  Procedure: Real-time Ultrasound Guided Injection of left acromioclavicular joint Device: GE Logiq Q7 Ultrasound guided  injection is preferred based studies that show increased duration, increased effect, greater accuracy, decreased procedural pain, increased response rate, and decreased cost with ultrasound guided versus blind injection.  Verbal informed consent obtained.  Time-out conducted.  Noted no overlying erythema, induration, or other signs of local infection.  Skin prepped in a sterile fashion.  Local anesthesia: Topical Ethyl chloride.  With sterile technique and under real time ultrasound guidance: With a 25-gauge half inch needle injected with 0.5 cc of 0.5% Marcaine and 0.5 cc of Kenalog 40 mg/mL Completed without difficulty  Advised to call if fevers/chills, erythema, induration, drainage, or persistent bleeding.  Impression: Technically successful ultrasound guided injection.    Impression and Recommendations:     The above documentation has been reviewed and is accurate and complete Judi Saa, DO

## 2022-10-28 ENCOUNTER — Other Ambulatory Visit: Payer: Self-pay

## 2022-10-28 ENCOUNTER — Ambulatory Visit (INDEPENDENT_AMBULATORY_CARE_PROVIDER_SITE_OTHER): Payer: BC Managed Care – PPO | Admitting: Family Medicine

## 2022-10-28 ENCOUNTER — Encounter: Payer: Self-pay | Admitting: Family Medicine

## 2022-10-28 VITALS — BP 134/90 | HR 87 | Ht 69.0 in | Wt 224.0 lb

## 2022-10-28 DIAGNOSIS — M19011 Primary osteoarthritis, right shoulder: Secondary | ICD-10-CM | POA: Diagnosis not present

## 2022-10-28 DIAGNOSIS — G8929 Other chronic pain: Secondary | ICD-10-CM

## 2022-10-28 DIAGNOSIS — M25512 Pain in left shoulder: Secondary | ICD-10-CM

## 2022-10-28 DIAGNOSIS — M75112 Incomplete rotator cuff tear or rupture of left shoulder, not specified as traumatic: Secondary | ICD-10-CM | POA: Diagnosis not present

## 2022-10-28 DIAGNOSIS — M19012 Primary osteoarthritis, left shoulder: Secondary | ICD-10-CM

## 2022-10-28 DIAGNOSIS — M75102 Unspecified rotator cuff tear or rupture of left shoulder, not specified as traumatic: Secondary | ICD-10-CM | POA: Insufficient documentation

## 2022-10-28 NOTE — Assessment & Plan Note (Signed)
Patient is highly active but does have the partial tear noted of the supraspinatus.  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  Given injections and hopeful that this will make some improvement.  Patient wants to make it through the year before he would consider the possibility of PRP.  Discussed which activities to do and which ones to avoid.  Follow-up again in 6 to 8 weeks otherwise.

## 2022-10-28 NOTE — Patient Instructions (Addendum)
Injections in shoulder today See you again for PRP

## 2022-10-28 NOTE — Assessment & Plan Note (Signed)
Repeat injection given today.  I do feel like this is causing some of the impingement in the shoulder as well.  Discussed icing regimen and home exercises.  Discussed avoiding certain activities.  Follow-up with me again in 6 to 8 weeks.

## 2022-12-31 ENCOUNTER — Ambulatory Visit: Payer: BC Managed Care – PPO | Admitting: Allergy and Immunology

## 2023-01-19 ENCOUNTER — Encounter: Payer: Self-pay | Admitting: Allergy and Immunology

## 2023-01-19 ENCOUNTER — Ambulatory Visit: Payer: BC Managed Care – PPO | Admitting: Allergy and Immunology

## 2023-01-19 VITALS — BP 146/82 | HR 82 | Resp 16

## 2023-01-19 DIAGNOSIS — J3089 Other allergic rhinitis: Secondary | ICD-10-CM | POA: Diagnosis not present

## 2023-01-19 DIAGNOSIS — K219 Gastro-esophageal reflux disease without esophagitis: Secondary | ICD-10-CM | POA: Diagnosis not present

## 2023-01-19 DIAGNOSIS — J4550 Severe persistent asthma, uncomplicated: Secondary | ICD-10-CM | POA: Diagnosis not present

## 2023-01-19 MED ORDER — AIRSUPRA 90-80 MCG/ACT IN AERO: 2.0000 | INHALATION_SPRAY | Freq: Four times a day (QID) | RESPIRATORY_TRACT | 2 refills | Status: AC | PRN

## 2023-01-19 MED ORDER — PANTOPRAZOLE SODIUM 40 MG PO TBEC: DELAYED_RELEASE_TABLET | ORAL | 1 refills | Status: DC

## 2023-01-19 NOTE — Patient Instructions (Addendum)
    1. Continue to perform Allergen avoidance measures as best as possible  2. Continue to Treat and prevent inflammation:   A. Nasonex - 1 spray each nostril 1 time per day  B. montelukast 10 mg - 1 tablet 1 time per day  C. Breztri - 2 inhalations 2 times per day (empty lungs)  D. Mepolizumab injections   3. Treat and prevent reflux / LPR:   A. Protonix 40 mg - 1 tablet 1-2 times per day  4. If needed:   A. AIRSUPRA -  2 inhalations every 6 hours (Coupon)  B. loratadine 10 mg one tablet one time per day  C. nasal saline wash  5. Return to clinic in 12 months or earlier if needed  6. GET A FLU VACCINE

## 2023-01-19 NOTE — Progress Notes (Unsigned)
[place] [facility] [place] [facility] [place] [facility] [place] [facility] [place]   Follow-up Note  Referring Provider: Hadley Pen, MD Primary Provider: Hadley Pen, MD Date of Office Visit: 01/19/2023  Subjective:   Nicolas Willis (DOB: [birth year]) is a 57 y.o. male who [facility] in re-evaluation of the following:  HPI: Nicolas Willis returns to this clinic in evaluation of asthma, allergic rhinitis, nasal polyposis, LPR.  I last saw him in this clinic 30 June 2022.  He has really done well with his asthma and has not required a systemic steroid to treat an exacerbation and rarely has any significant respiratory tract problems that require him to use the short acting bronchodilator while he continues on Breztri and montelukast once a day and he is using Mepolizumab injections at home.  And his upper airway has been doing very well with intermittent use of a nasal steroid.  He has not required an antibiotic to treat an episode of sinusitis.  And his reflux is under very good control as long as he uses his Protonix twice a day.  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  Allergies as of 01/19/2023   No Known Allergies      Medication List    acetaminophen 325 MG tablet Commonly known as: TYLENOL Take 2 tablets (650 mg total) by mouth every 6 (six) hours as needed for mild pain or headache (fever >/= 101).   albuterol 108 (90 Base) MCG/ACT inhaler Commonly known as: Proventil HFA Can inhale two puffs every four to six hours as needed for cough or wheeze.   amLODipine 5 MG tablet Commonly known as: NORVASC Take 5 mg by mouth at bedtime.   Breztri Aerosphere 160-9-4.8 MCG/ACT Aero Generic drug: Budeson-Glycopyrrol-Formoterol Inhale two puffs twice daily to prevent cough or wheeze.  Rinse, gargle, and spit after use.   fexofenadine 180 MG tablet Commonly known as: ALLEGRA Take 180 mg by mouth daily.   meloxicam 15 MG  tablet Commonly known as: MOBIC Take 15 mg by mouth daily.   mometasone 50 MCG/ACT nasal spray Commonly known as: Nasonex Use one spray in each nostril once or twice daily   montelukast 10 MG tablet Commonly known as: SINGULAIR Take by mouth.   Nucala 100 MG/ML Soaj Generic drug: Mepolizumab Inject 1 mL (100 mg total) into the skin every 28 (twenty-eight) days.   pantoprazole 40 MG tablet Commonly known as: PROTONIX Take one tablet by mouth twice daily as directed.   sterile water (preservative free) injection USE AS DIRECTED    Past Medical History:  Diagnosis Date   Asthma    Frequent sinus infections     Past Surgical History:  Procedure Laterality Date   SINOSCOPY      Review of systems negative except as noted in HPI / PMHx or noted below:  Review of Systems  Constitutional: Negative.   HENT: Negative.    Eyes: Negative.   Respiratory: Negative.    Cardiovascular: Negative.   Gastrointestinal: Negative.   Genitourinary: Negative.   Musculoskeletal: Negative.   Skin: Negative.   Neurological: Negative.   Endo/Heme/Allergies: Negative.   Psychiatric/Behavioral: Negative.       Objective:   Vitals:   01/19/23 0856  BP: (!) 146/82  Pulse: 82  Resp: 16  SpO2: 98%          Physical Exam Constitutional:      Appearance: He is not diaphoretic.  HENT:  Head: Normocephalic.     Right Ear: Tympanic membrane, ear canal and external ear normal.     Left Ear: Tympanic membrane, ear canal and external ear normal.     Nose: Nose normal. No mucosal edema or rhinorrhea.     Mouth/Throat:     Pharynx: Uvula midline. No oropharyngeal exudate.  Eyes:     Conjunctiva/sclera: Conjunctivae normal.  Neck:     Thyroid: No thyromegaly.     Trachea: Trachea normal. No tracheal tenderness or tracheal deviation.  Cardiovascular:     Rate and Rhythm: Normal rate and regular rhythm.     Heart sounds: Normal heart sounds, S1 normal and S2 normal. No murmur  heard. Pulmonary:     Effort: No respiratory distress.     Breath sounds: Normal breath sounds. No stridor. No wheezing or rales.  Lymphadenopathy:     Head:     Right side of head: No tonsillar adenopathy.     Left side of head: No tonsillar adenopathy.     Cervical: No cervical adenopathy.  Skin:    Findings: No erythema or rash.     Nails: There is no clubbing.  Neurological:     Mental Status: He is alert.     Diagnostics: Spirometry was performed and demonstrated an FEV1 of 3.57 at 96 % of predicted.  Assessment and Plan:   1. Asthma, severe persistent, well-controlled   2. Other allergic rhinitis   3. LPRD (laryngopharyngeal reflux disease)     1. Continue to perform Allergen avoidance measures as best as possible  2. Continue to Treat and prevent inflammation:   A. Nasonex - 1 spray each nostril 1 time per day  B. montelukast 10 mg - 1 tablet 1 time per day  C. Breztri - 2 inhalations 2 times per day (empty lungs)  D. Mepolizumab injections   3. Treat and prevent reflux / LPR:   A. Protonix 40 mg - 1 tablet 1-2 times per day  4. If needed:   A. AIRSUPRA -  2 inhalations every 6 hours (Coupon)  B. loratadine 10 mg one tablet one time per day  C. nasal saline wash  5. Return to clinic in 12 months or earlier if needed  6. GET A FLU VACCINE  Braelynn is doing wonderful and he has a very good idea about his disease state and how his medications work and appropriate dosing of his medications depending on disease activity and he will continue to utilize a collection of anti-inflammatory agents for his airway including use of Mepolizumab injections and also continue to address this issue with LPR.  We have given him an anti-inflammatory rescue medicine today.  I will see him back in this clinic in 1 year or earlier if there is a problem.  Laurette Schimke, MD Allergy / Immunology  [facility]

## 2023-01-20 ENCOUNTER — Encounter: Payer: Self-pay | Admitting: Allergy and Immunology

## 2023-03-16 NOTE — Progress Notes (Signed)
 [facility] [address] [place] [address] Phone: [phone] Subjective:    I'm seeing this patient by the request  of:  Silver Lamar LABOR, MD  CC: Shoulder pain follow-up  YEP:Dlagzrupcz  10/28/2022 Repeat injection given today.  I do feel like this is causing some of the impingement in the shoulder as well.  Discussed icing regimen and home exercises.  Discussed avoiding certain activities.  Follow-up with me again in 6 to 8 weeks.     Patient is highly active but does have the partial tear noted of the supraspinatus.  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  Given injections and hopeful that this will make some improvement.  Patient wants to make it through the year before he would consider the possibility of PRP.  Discussed which activities to do and which ones to avoid.  Follow-up again in 6 to 8 weeks otherwise.      Update 03/24/2023 Val Schiavo is a 57 y.o. male coming in with complaint of L RTC and B AC joint pain.  Patient previously was given injections in bilateral acromioclavicular joints in August.  Was also given a glenohumeral joint injection in the left shoulder for rotator cuff tear in August.  Patient states shoulder is still the same he is ready to try the PRP        Past Medical History:  Diagnosis Date   Asthma    Frequent sinus infections    Past Surgical History:  Procedure Laterality Date   SINOSCOPY     Social History   Socioeconomic History   Marital status: Married    Spouse name: Not on file   Number of children: Not on file   Years of education: Not on file   Highest education level: Not on file  Occupational History   Not on file  Tobacco Use   Smoking status: Never   Smokeless tobacco: Never  Vaping Use   Vaping status: Never Used  Substance and Sexual Activity   Alcohol use: No   Drug use: No   Sexual activity: Not on  file  Other Topics Concern   Not on file  Social History Narrative   Not on file   Social Drivers of Health   Financial Resource Strain: Not on file  Food Insecurity: Not on file  Transportation Needs: Not on file  Physical Activity: Not on file  Stress: Not on file  Social Connections: Not on file   No Known Allergies Family History  Problem Relation Age of Onset   Leukemia Mother    Heart disease Maternal Grandmother    Heart disease Maternal Grandfather       Current Outpatient Medications (Cardiovascular):    amLODipine  (NORVASC ) 5 MG tablet, Take 5 mg by mouth at bedtime.    Current Outpatient Medications (Respiratory):    albuterol  (PROVENTIL  HFA) 108 (90 Base) MCG/ACT inhaler, Can inhale two puffs every four to six hours as needed for cough or wheeze.   Albuterol -Budesonide  (AIRSUPRA ) 90-80 MCG/ACT AERO, Inhale 2 Inhalations into the lungs every 6 (six) hours as needed.   Budeson-Glycopyrrol-Formoterol (BREZTRI  AEROSPHERE) 160-9-4.8 MCG/ACT AERO, Inhale two puffs twice daily to prevent cough or wheeze.  Rinse, gargle, and spit after use.   fexofenadine (ALLEGRA) 180 MG tablet, Take 180 mg by mouth daily.   Mepolizumab  (NUCALA ) 100 MG/ML SOAJ, Inject 1 mL (100 mg total) into the skin  every 28 (twenty-eight) days.   mometasone  (NASONEX ) 50 MCG/ACT nasal spray, Use one spray in each nostril once or twice daily (Patient taking differently: Place 1 spray into the nose at bedtime.)   montelukast  (SINGULAIR ) 10 MG tablet, Take by mouth.  Current Facility-Administered Medications (Respiratory):    Mepolizumab  SOLR 100 mg  Current Outpatient Medications (Analgesics):    acetaminophen  (TYLENOL ) 325 MG tablet, Take 2 tablets (650 mg total) by mouth every 6 (six) hours as needed for mild pain or headache (fever >/= 101).   meloxicam (MOBIC) 15 MG tablet, Take 15 mg by mouth daily.     Current Outpatient Medications (Other):    pantoprazole  (PROTONIX ) 40 MG tablet, Take one  tablet by mouth twice daily as directed.   Water For Injection Sterile (STERILE WATER, PRESERVATIVE FREE,) injection, USE AS DIRECTED    Reviewed prior external information including notes and imaging from  primary care provider As well as notes that were available from care everywhere and other healthcare systems.  Past medical history, social, surgical and family history all reviewed in electronic medical record.  No pertanent information unless stated regarding to the chief complaint.   Review of Systems:  No headache, visual changes, nausea, vomiting, diarrhea, constipation, dizziness, abdominal pain, skin rash, fevers, chills, night sweats, weight loss, swollen lymph nodes, body aches, joint swelling, chest pain, shortness of breath, mood changes. POSITIVE muscle aches  Objective  Blood pressure (!) 162/92, pulse 75, height 5' 9 (1.753 m), SpO2 93%.   General: No apparent distress alert and oriented x3 mood and affect normal, dressed appropriately.  HEENT: Pupils equal, extraocular movements intact  Respiratory: Patient's speak in full sentences and does not appear short of breath  Cardiovascular: No lower extremity edema, non tender, no erythema  Shoulder exam shows positive impingement noted on the shoulder today.   Procedure: Real-time Ultrasound Guided Injection of left subacromial space and supraspinatus and infraspinatus tendon sheath Device: GE Logiq E  Ultrasound guided injection is preferred based studies that show increased duration, increased effect, greater accuracy, decreased procedural pain, increased response rate with ultrasound guided versus blind injection.  Verbal informed consent obtained.  Time-out conducted.  Noted no overlying erythema, induration, or other signs of local infection.  Skin prepped in a sterile fashion.  Local anesthesia: Topical Ethyl chloride.  With sterile technique and under real time ultrasound guidance:  Joint visualized.  21g 2 inch  needle inserted lateral approach. Pictures taken for needle placement. Patient did have injection of 2 cc of 0.5% Marcaine, and then injected 5 cc of PRP leukocyte rich.  This was in the tendon sheath itself mostly of the supraspinatus and infraspinatus anteriorly. Completed without difficulty  Pain immediately resolved suggesting accurate placement of the medication.  Advised to call if fevers/chills, erythema, induration, drainage, or persistent bleeding.  Impression: Technically successful ultrasound guided injection.      Impression and Recommendations:    The above documentation has been reviewed and is accurate and complete Akeisha Lagerquist M Aubrie Lucien, DO

## 2023-03-20 ENCOUNTER — Telehealth: Payer: Self-pay

## 2023-03-20 NOTE — Telephone Encounter (Signed)
*  Asthma/Allergy  Pharmacy Patient Advocate Encounter   Received notification from CoverMyMeds that prior authorization for Airsupra  90-80MCG/ACT aerosol  is required/requested.   Insurance verification completed.   The patient is insured through [facility] .   Per test claim: PA required; PA submitted to above mentioned insurance via CoverMyMeds Key/confirmation #/EOC [id number] Status is pending   Patient also has coupon and may use this for coverage

## 2023-03-25 ENCOUNTER — Other Ambulatory Visit: Payer: Self-pay

## 2023-03-25 ENCOUNTER — Ambulatory Visit (INDEPENDENT_AMBULATORY_CARE_PROVIDER_SITE_OTHER): Payer: Self-pay | Admitting: Family Medicine

## 2023-03-25 ENCOUNTER — Encounter: Payer: Self-pay | Admitting: Family Medicine

## 2023-03-25 VITALS — BP 162/92 | HR 75 | Ht 69.0 in

## 2023-03-25 DIAGNOSIS — M25512 Pain in left shoulder: Secondary | ICD-10-CM

## 2023-03-25 DIAGNOSIS — G8929 Other chronic pain: Secondary | ICD-10-CM

## 2023-03-25 DIAGNOSIS — M75112 Incomplete rotator cuff tear or rupture of left shoulder, not specified as traumatic: Secondary | ICD-10-CM

## 2023-03-25 NOTE — Assessment & Plan Note (Signed)
 PRP injection given, post PRP instructions given.

## 2023-03-30 NOTE — Telephone Encounter (Signed)
 Pharmacy Patient Advocate Encounter  Received notification from [facility] that Prior Authorization for Airsupra  has been DENIED.  No reason given; No denial letter received via Fax or CMM. It has been requested and will be uploaded to the media tab once received.   PA #/Case ID/Reference #: [id number]

## 2023-05-22 NOTE — Progress Notes (Signed)
 [name] [address] [place] [address] Phone: [phone] Subjective:   Bruce Donath, am serving as a scribe for Dr. Antoine Primas.  I'm seeing this patient by the request  of:  Hadley Pen, MD  CC: Left shoulder follow-up  [id number]  Nicolas Willis is a 58 y.o. male coming in with complaint of L shoulder pain. PRP given last visit. Patient states that he has some stiffness when he wakes up. No pain otherwise.  Has been doing very well.  Looking forward to the potential for retirement in the relatively near future.  Has been lifting again with no pain.     Past Medical History:  Diagnosis Date   Asthma    Frequent sinus infections    Past Surgical History:  Procedure Laterality Date   SINOSCOPY     Social History   Socioeconomic History   Marital status: Married    Spouse name: Not on file   Number of children: Not on file   Years of education: Not on file   Highest education level: Not on file  Occupational History   Not on file  Tobacco Use   Smoking status: Never   Smokeless tobacco: Never  Vaping Use   Vaping status: Never Used  Substance and Sexual Activity   Alcohol use: No   Drug use: No   Sexual activity: Not on file  Other Topics Concern   Not on file  Social History Narrative   Not on file   Social Drivers of Health   Financial Resource Strain: Not on file  Food Insecurity: Not on file  Transportation Needs: Not on file  Physical Activity: Not on file  Stress: Not on file  Social Connections: Not on file   No Known Allergies Family History  Problem Relation Age of Onset   Leukemia Mother    Heart disease Maternal Grandmother    Heart disease Maternal Grandfather       Current Outpatient Medications (Cardiovascular):    amLODipine (NORVASC) 5 MG tablet, Take 5 mg by mouth at bedtime.    Current Outpatient Medications (Respiratory):    albuterol (PROVENTIL HFA) 108 (90 Base)  MCG/ACT inhaler, Can inhale two puffs every four to six hours as needed for cough or wheeze.   Albuterol-Budesonide (AIRSUPRA) 90-80 MCG/ACT AERO, Inhale 2 Inhalations into the lungs every 6 (six) hours as needed.   Budeson-Glycopyrrol-Formoterol (BREZTRI AEROSPHERE) 160-9-4.8 MCG/ACT AERO, Inhale two puffs twice daily to prevent cough or wheeze.  Rinse, gargle, and spit after use.   fexofenadine (ALLEGRA) 180 MG tablet, Take 180 mg by mouth daily.   Mepolizumab (NUCALA) 100 MG/ML SOAJ, Inject 1 mL (100 mg total) into the skin every 28 (twenty-eight) days.   mometasone (NASONEX) 50 MCG/ACT nasal spray, Use one spray in each nostril once or twice daily (Patient taking differently: Place 1 spray into the nose at bedtime.)   montelukast (SINGULAIR) 10 MG tablet, Take by mouth.  Current Facility-Administered Medications (Respiratory):    Mepolizumab SOLR 100 mg  Current Outpatient Medications (Analgesics):    acetaminophen (TYLENOL) 325 MG tablet, Take 2 tablets (650 mg total) by mouth every 6 (six) hours as needed for mild pain or headache (fever >/= 101).   meloxicam (MOBIC) 15 MG tablet, Take 15 mg by mouth daily.     Current Outpatient Medications (Other):    pantoprazole (PROTONIX) 40 MG tablet, Take one tablet by mouth twice daily as directed.   Water For  Injection Sterile (STERILE WATER, PRESERVATIVE FREE,) injection, USE AS DIRECTED     Objective  Blood pressure 120/88, pulse 86, height 5\' 9"  (1.753 m), weight 225 lb (102.1 kg), SpO2 96%.   General: No apparent distress alert and oriented x3 mood and affect normal, dressed appropriately.  HEENT: Pupils equal, extraocular movements intact  Respiratory: Patient's speak in full sentences and does not appear short of breath  Cardiovascular: No lower extremity edema, non tender, no erythema  Left shoulder does have good range of motion noted today.  Improvement in strength noted.  Very mild impingement still noted.  Limited muscular  skeletal ultrasound was performed and interpreted by Antoine Primas, M  Significant interval healing noted of the infraspinatus anteriorly from previous exam.  Full scar tissue formation noted.  Already regeneration of the tendon noted as well. Impression: 90% healed and seems to be improving from there.    Impression and Recommendations:     The above documentation has been reviewed and is accurate and complete Judi Saa, DO

## 2023-05-25 ENCOUNTER — Encounter: Payer: Self-pay | Admitting: Family Medicine

## 2023-05-25 ENCOUNTER — Other Ambulatory Visit: Payer: Self-pay

## 2023-05-25 ENCOUNTER — Ambulatory Visit (INDEPENDENT_AMBULATORY_CARE_PROVIDER_SITE_OTHER): Payer: BC Managed Care – PPO | Admitting: Family Medicine

## 2023-05-25 VITALS — BP 120/88 | HR 86 | Ht 69.0 in | Wt 225.0 lb

## 2023-05-25 DIAGNOSIS — M75112 Incomplete rotator cuff tear or rupture of left shoulder, not specified as traumatic: Secondary | ICD-10-CM

## 2023-05-25 DIAGNOSIS — M25512 Pain in left shoulder: Secondary | ICD-10-CM | POA: Diagnosis not present

## 2023-05-25 NOTE — Assessment & Plan Note (Addendum)
 Remarkable improvement at this time.  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  We discussed with patient that we can potentially consider injections in the joint as needed.  Discussed icing regimen of home exercises, which activities to do and which ones to avoid.  Can follow-up with me again in 3 months.

## 2023-07-07 NOTE — Addendum Note (Signed)
 Addended by: Evangelina Hilt on: 07/07/2023 11:40 AM   Modules accepted: Orders

## 2023-07-13 ENCOUNTER — Other Ambulatory Visit: Payer: Self-pay | Admitting: Allergy and Immunology

## 2023-08-18 NOTE — Progress Notes (Deleted)
 [facility] [address] [place] [address] Phone: [phone] Subjective:    I'm seeing this patient by the request  of:  Melva Stabile, MD  CC:   [id number]  05/25/2023 Remarkable improvement at this time.  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  We discussed with patient that we can potentially consider injections in the joint as needed.  Discussed icing regimen of home exercises, which activities to do and which ones to avoid.  Can follow-up with me again in 3 months.     Update 08/19/2023 Nicolas Willis is a 58 y.o. male coming in with complaint of L shoulder pain. Patient states       Past Medical History:  Diagnosis Date   Asthma    Frequent sinus infections    Past Surgical History:  Procedure Laterality Date   SINOSCOPY     Social History   Socioeconomic History   Marital status: Married    Spouse name: Not on file   Number of children: Not on file   Years of education: Not on file   Highest education level: Not on file  Occupational History   Not on file  Tobacco Use   Smoking status: Never   Smokeless tobacco: Never  Vaping Use   Vaping status: Never Used  Substance and Sexual Activity   Alcohol use: No   Drug use: No   Sexual activity: Not on file  Other Topics Concern   Not on file  Social History Narrative   Not on file   Social Drivers of Health   Financial Resource Strain: Not on file  Food Insecurity: Not on file  Transportation Needs: Not on file  Physical Activity: Not on file  Stress: Not on file  Social Connections: Not on file   No Known Allergies Family History  Problem Relation Age of Onset   Leukemia Mother    Heart disease Maternal Grandmother    Heart disease Maternal Grandfather       Current Outpatient Medications (Cardiovascular):    amLODipine  (NORVASC ) 5 MG tablet, Take 5 mg by mouth at bedtime.    Current  Outpatient Medications (Respiratory):    albuterol  (PROVENTIL  HFA) 108 (90 Base) MCG/ACT inhaler, Can inhale two puffs every four to six hours as needed for cough or wheeze.   Albuterol -Budesonide (AIRSUPRA ) 90-80 MCG/ACT AERO, Inhale 2 Inhalations into the lungs every 6 (six) hours as needed.   Budeson-Glycopyrrol-Formoterol (BREZTRI  AEROSPHERE) 160-9-4.8 MCG/ACT AERO, Inhale two puffs twice daily to prevent cough or wheeze.  Rinse, gargle, and spit after use.   FASENRA PEN 30 MG/ML prefilled autoinjector, INJECT THE CONTENTS OF 1 PEN (30 MG) UNDER THE SKIN AT WEEK 8, THEN EVERY 8 WEEKS THEREAFTER   fexofenadine (ALLEGRA) 180 MG tablet, Take 180 mg by mouth daily.   mometasone  (NASONEX ) 50 MCG/ACT nasal spray, Use one spray in each nostril once or twice daily (Patient taking differently: Place 1 spray into the nose at bedtime.)   montelukast  (SINGULAIR ) 10 MG tablet, Take by mouth.  Current Facility-Administered Medications (Respiratory):    Mepolizumab  SOLR 100 mg  Current Outpatient Medications (Analgesics):    acetaminophen  (TYLENOL ) 325 MG tablet, Take 2 tablets (650 mg total) by mouth every 6 (six) hours as needed for mild pain or headache (fever >/= 101).   meloxicam (MOBIC) 15 MG tablet, Take 15 mg by mouth daily.     Current Outpatient Medications (Other):  pantoprazole  (PROTONIX ) 40 MG tablet, Take one tablet by mouth twice daily as directed.   Water For Injection Sterile (STERILE WATER, PRESERVATIVE FREE,) injection, USE AS DIRECTED    Reviewed prior external information including notes and imaging from  primary care provider As well as notes that were available from care everywhere and other healthcare systems.  Past medical history, social, surgical and family history all reviewed in electronic medical record.  No pertanent information unless stated regarding to the chief complaint.   Review of Systems:  No headache, visual changes, nausea, vomiting, diarrhea,  constipation, dizziness, abdominal pain, skin rash, fevers, chills, night sweats, weight loss, swollen lymph nodes, body aches, joint swelling, chest pain, shortness of breath, mood changes. POSITIVE muscle aches  Objective  There were no vitals taken for this visit.   General: No apparent distress alert and oriented x3 mood and affect normal, dressed appropriately.  HEENT: Pupils equal, extraocular movements intact  Respiratory: Patient's speak in full sentences and does not appear short of breath  Cardiovascular: No lower extremity edema, non tender, no erythema      Impression and Recommendations:

## 2023-08-25 ENCOUNTER — Ambulatory Visit: Admitting: Family Medicine

## 2023-09-10 ENCOUNTER — Other Ambulatory Visit: Payer: Self-pay | Admitting: Allergy and Immunology

## 2023-10-20 ENCOUNTER — Encounter: Payer: Self-pay | Admitting: Allergy and Immunology

## 2023-10-20 ENCOUNTER — Other Ambulatory Visit: Payer: Self-pay

## 2023-10-20 ENCOUNTER — Other Ambulatory Visit (HOSPITAL_COMMUNITY): Payer: Self-pay

## 2023-10-20 MED ORDER — AIRSUPRA 90-80 MCG/ACT IN AERO
2.0000 | INHALATION_SPRAY | Freq: Four times a day (QID) | RESPIRATORY_TRACT | 2 refills | Status: AC
  Filled 2023-10-20 – 2023-10-23 (×2): qty 10.7, 30d supply, fill #0
  Filled 2023-11-17: qty 10.7, 30d supply, fill #1

## 2023-10-20 MED ORDER — PANTOPRAZOLE SODIUM 40 MG PO TBEC
40.0000 mg | DELAYED_RELEASE_TABLET | Freq: Two times a day (BID) | ORAL | 1 refills | Status: AC
  Filled 2023-10-20 – 2023-10-23 (×2): qty 180, 90d supply, fill #0

## 2023-10-23 ENCOUNTER — Other Ambulatory Visit (HOSPITAL_COMMUNITY): Payer: Self-pay

## 2023-10-23 ENCOUNTER — Other Ambulatory Visit: Payer: Self-pay

## 2023-10-28 ENCOUNTER — Other Ambulatory Visit: Payer: Self-pay | Admitting: *Deleted

## 2023-10-28 IMAGING — MR MR KNEE*R* W/O CM
4 of 7 series · 21 of 40 positions shown · non-contrast
Comparison: None.

CLINICAL DATA: Meniscal injury, knee knee pain

EXAM:
MRI OF THE RIGHT KNEE WITHOUT CONTRAST
TECHNIQUE: Multiplanar, multisequence MR imaging of the knee was performed. No
intravenous contrast was administered.

[Series 3: T2 fat-sat · axial · 4.0mm · 0.50mm/px · z∈[-51,+63]mm · 4 of 24 slices shown]
[im 1/24]
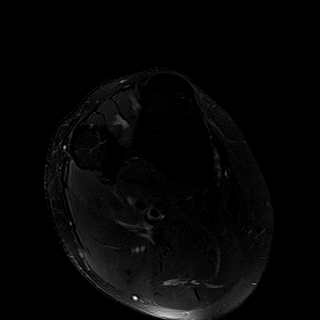
[im 8/24]
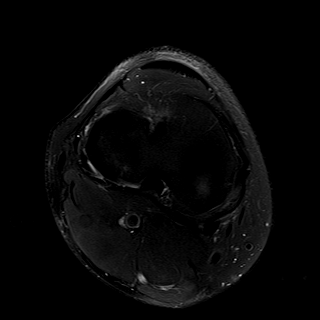
[im 16/24]
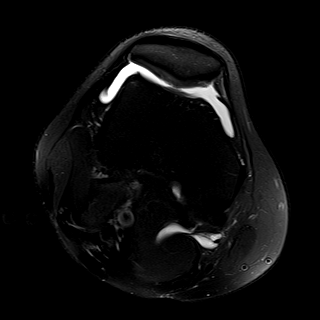
[im 24/24]
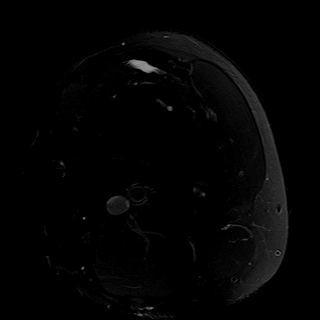

[Series 6: PD fat-sat · coronal · 3.0mm · 0.29mm/px · 8 of 39 slices shown (1 of 3)]
[im 1/39]
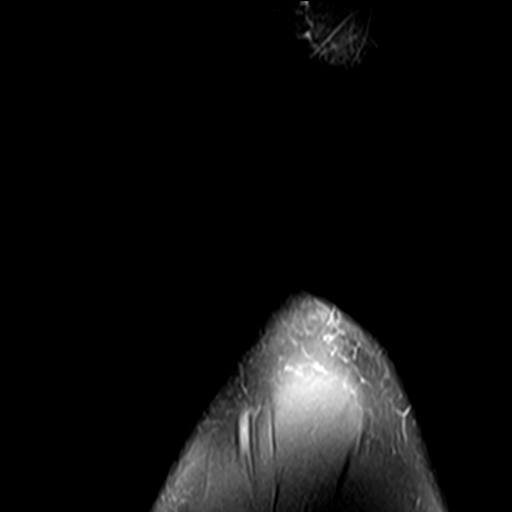
[im 6/39]
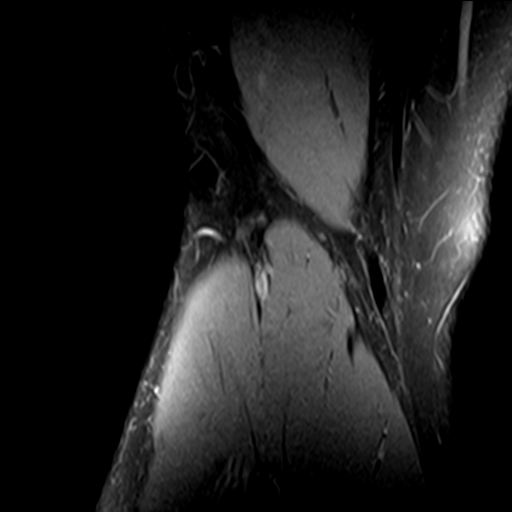
[im 11/39]
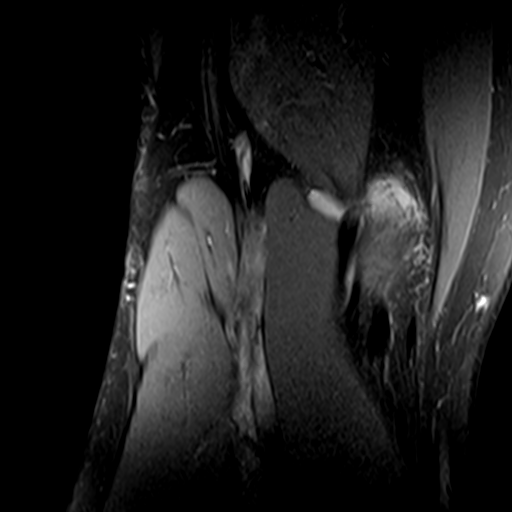
[im 17/39]
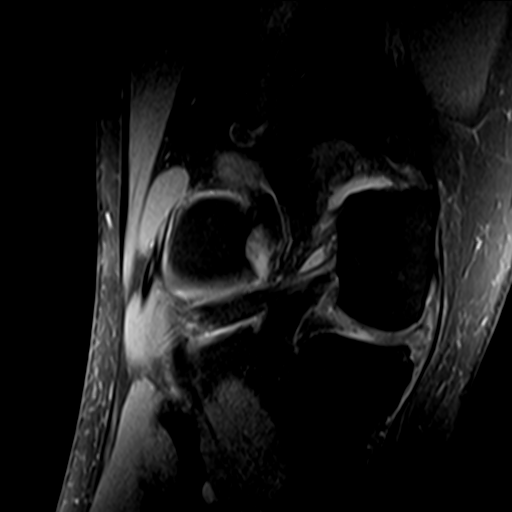
[im 22/39]
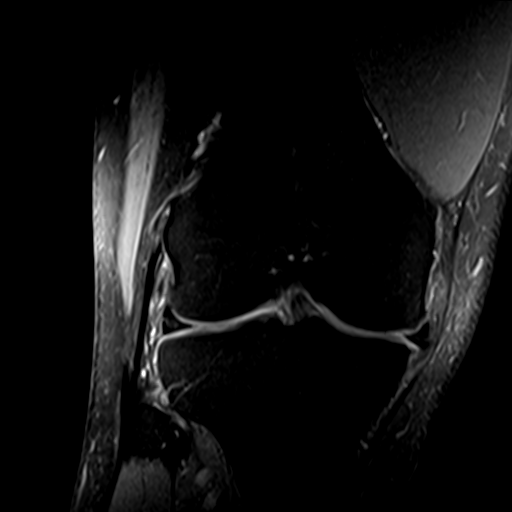
[im 28/39]
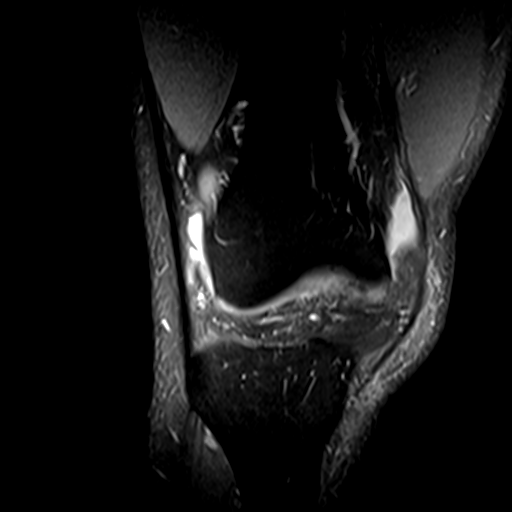
[im 33/39]
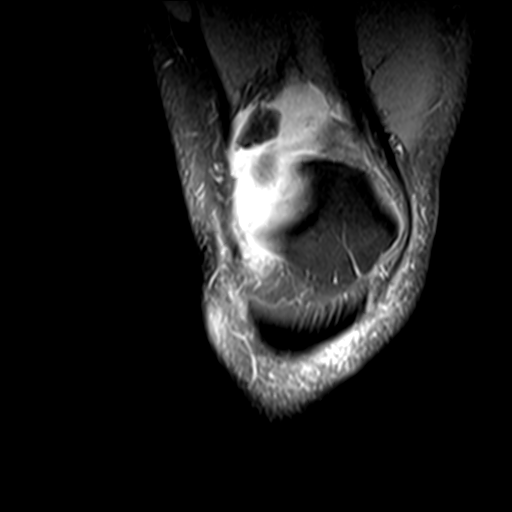
[im 39/39]
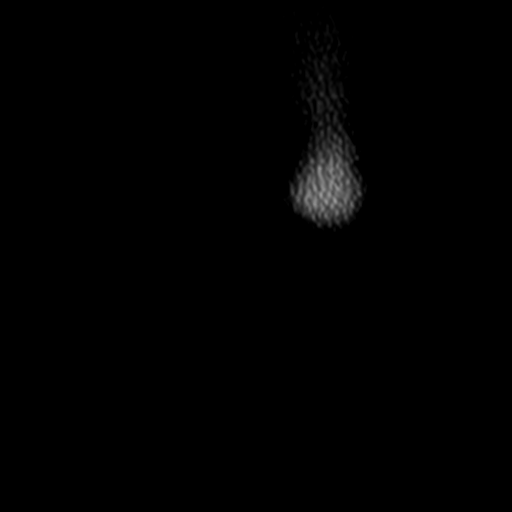

[Series 8: PD fat-sat · sagittal · 3.0mm · 0.29mm/px · 7 of 32 slices shown (2 of 3)]
[im 1/32]
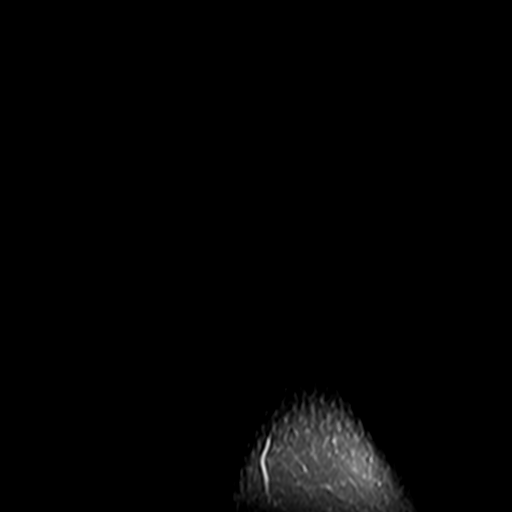
[im 6/32]
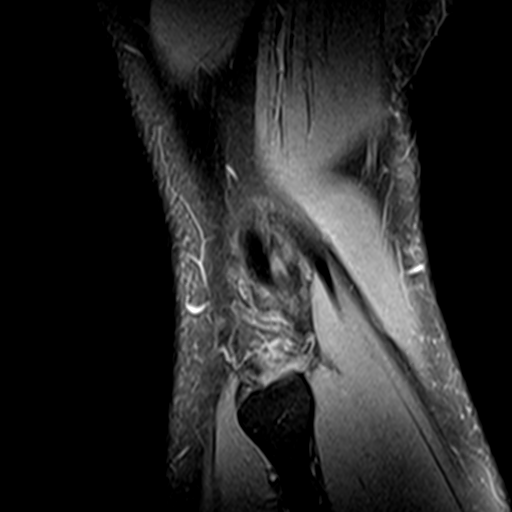
[im 11/32]
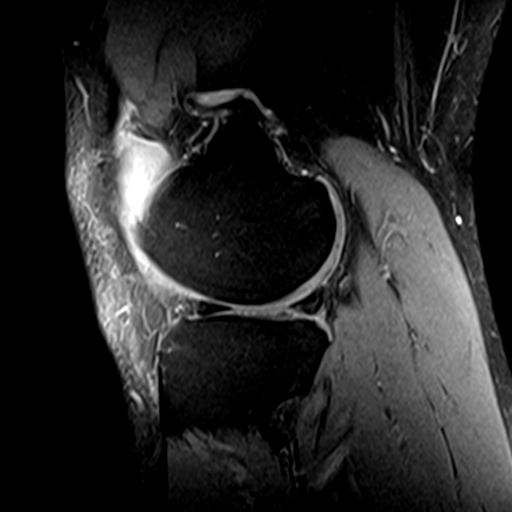
[im 16/32]
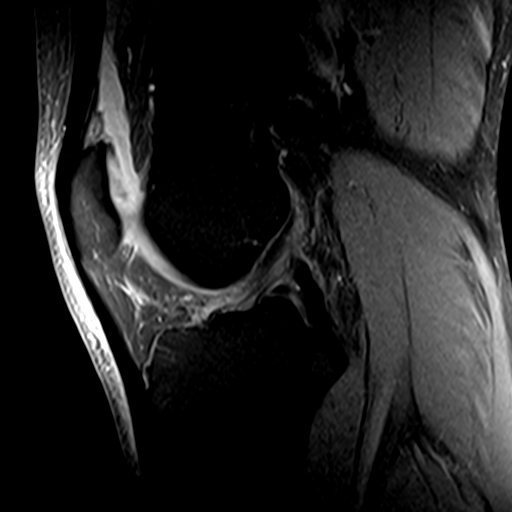
[im 21/32]
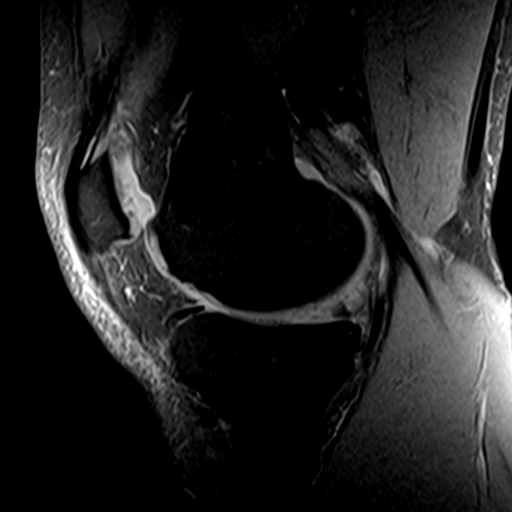
[im 26/32]
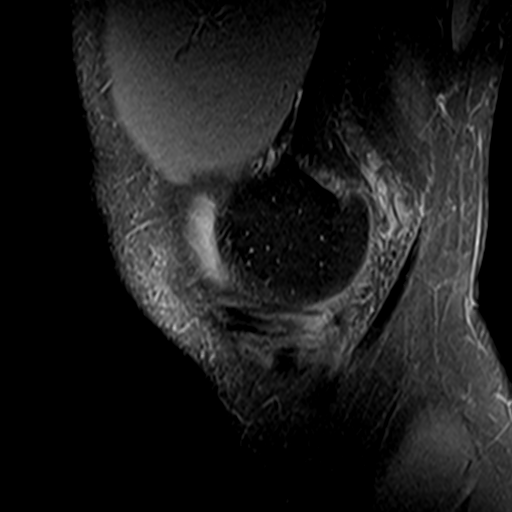
[im 32/32]
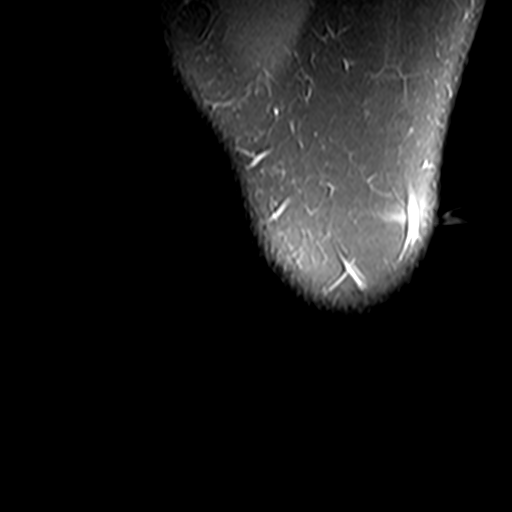

[Series 9: PD fat-sat · oblique · 2.3mm · 0.29mm/px · 2 of 11 slices shown (3 of 3)]
[im 1/11]
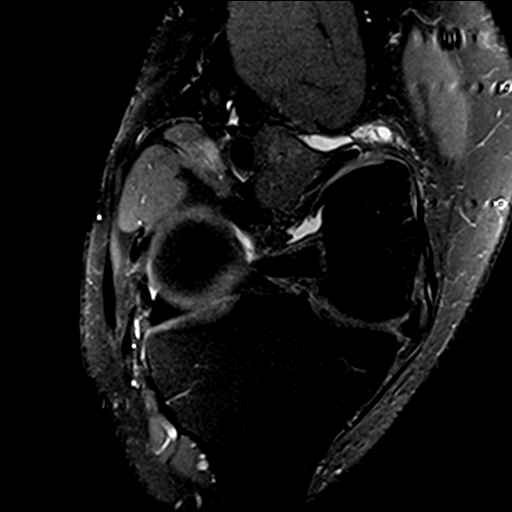
[im 11/11]
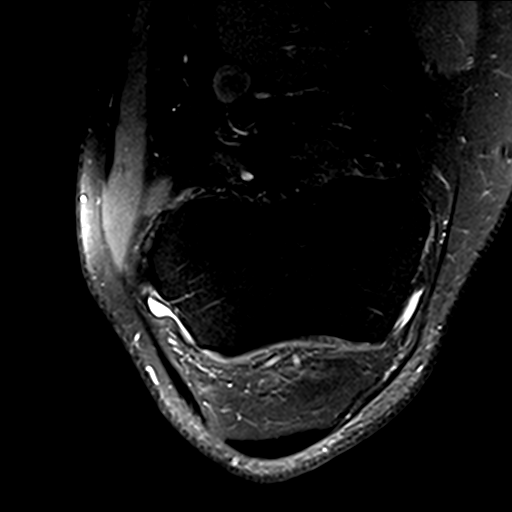

[21 of 40 positions shown; findings below may reference images not displayed]

FINDINGS: MENISCI

Medial: Complex tearing of the posterior horn and body of the medial
meniscus with substance loss. The tear extends to close proximity of
the posterior meniscal root.

Lateral: Intact.

LIGAMENTS

Cruciates: ACL and PCL are intact.

Collaterals: Medial collateral ligament is intact. Lateral
collateral ligament complex is intact.

CARTILAGE

Patellofemoral: Mild to moderate chondrosis with areas of chondral
fissuring.

Medial: Mild to moderate chondrosis with intermediate grade chondral
loss along the posterior tibial articular surface.

Lateral:  No chondral defect.

JOINT: Moderate-sized joint effusion.

POPLITEAL FOSSA: Small Baker cyst.

EXTENSOR MECHANISM: Intact quadriceps tendon. Intact patellar
tendon.

BONES: Tricompartment osteophyte formation. No acute fracture or
dislocation. No aggressive osseous lesion.

Other: No focal fluid collection. There is ganglion formation along
the medial gastrocnemius origin versus lobular extension of Baker
cyst.
IMPRESSION: Complex tearing of the posterior horn and body of the medial
meniscus with substance loss. The tear extends in close proximity to
the posterior meniscal roots.

Tricompartment osteoarthritis, worst in the medial and
patellofemoral compartments.

Moderate-sized joint effusion.  Small Baker cyst.

## 2023-10-28 MED ORDER — FASENRA PEN 30 MG/ML ~~LOC~~ SOAJ
30.0000 mg | SUBCUTANEOUS | 6 refills | Status: DC
  Filled 2023-11-02: qty 1, 56d supply, fill #0

## 2023-10-28 NOTE — Telephone Encounter (Signed)
 L/m for patient due to Change of Ins patient notified rx to [facility] for Fasenra 

## 2023-10-29 ENCOUNTER — Other Ambulatory Visit (HOSPITAL_COMMUNITY): Payer: Self-pay

## 2023-11-02 ENCOUNTER — Other Ambulatory Visit: Payer: Self-pay

## 2023-11-02 ENCOUNTER — Telehealth: Payer: Self-pay | Admitting: Pharmacist

## 2023-11-02 NOTE — Telephone Encounter (Signed)
 Called patient to schedule an appointment for the [facility]. I was unable to reach the patient so I left a HIPAA-compliant message requesting that the patient return my call.   Herlene Fleeta Morris, PharmD, JAQUELINE, CPP Clinical Pharmacist [facility] [phone]

## 2023-11-02 NOTE — Progress Notes (Signed)
 Pharmacy Patient Advocate Encounter  Insurance verification completed.   The patient is insured through [facility]   Ran test claim for Fasenra . Co-pay is $0. Patient has copay card.  This test claim was processed through [facility]- copay amounts may vary at other pharmacies due to pharmacy/plan contracts, or as the patient moves through the different stages of their insurance plan.

## 2023-11-04 ENCOUNTER — Telehealth: Payer: Self-pay | Admitting: Pharmacist

## 2023-11-04 NOTE — Telephone Encounter (Signed)
 Called patient to schedule an appointment for the [facility]. I was unable to reach the patient so I left a HIPAA-compliant message requesting that the patient return my call.   Nicolas Willis, PharmD, JAQUELINE, CPP Clinical Pharmacist [facility] [phone]

## 2023-11-05 ENCOUNTER — Other Ambulatory Visit: Payer: Self-pay

## 2023-11-05 ENCOUNTER — Encounter: Payer: Self-pay | Admitting: Pharmacist

## 2023-11-05 ENCOUNTER — Other Ambulatory Visit: Payer: Self-pay | Admitting: Pharmacist

## 2023-11-05 ENCOUNTER — Ambulatory Visit: Attending: Family Medicine | Admitting: Pharmacist

## 2023-11-05 ENCOUNTER — Other Ambulatory Visit (HOSPITAL_COMMUNITY): Payer: Self-pay

## 2023-11-05 DIAGNOSIS — Z79899 Other long term (current) drug therapy: Secondary | ICD-10-CM

## 2023-11-05 MED ORDER — FASENRA PEN 30 MG/ML ~~LOC~~ SOAJ
30.0000 mg | SUBCUTANEOUS | 6 refills | Status: AC
  Filled 2023-11-05: qty 1, 28d supply, fill #0
  Filled 2023-11-30 – 2023-12-24 (×2): qty 1, 28d supply, fill #1
  Filled 2023-12-28: qty 1, 56d supply, fill #1
  Filled 2023-12-30 (×2): qty 1, 28d supply, fill #1
  Filled 2023-12-30: qty 1, 56d supply, fill #1
  Filled 2024-02-18 (×3): qty 1, 56d supply, fill #2
  Filled 2024-03-18: qty 1, 56d supply, fill #3
  Filled 2024-04-14: qty 1, 30d supply, fill #3
  Filled 2024-04-18: qty 1, 56d supply, fill #3

## 2023-11-05 NOTE — Progress Notes (Signed)
 HPI Patient presents today to [facility] to see pharmacy team for Fasenra  counseling. Past medical history includes asthma.  Current regimen: Fasenra  30 mg once every 8 weeks.   Adherence: reported   Efficacy: reports that the medication works okay to control symptoms. Does notice trouble with different triggers like smoke and climate changes. But for the most part, he does well on the medication.   OBJECTIVE No Known Allergies  Outpatient Encounter Medications as of 11/05/2023  Medication Sig   acetaminophen  (TYLENOL ) 325 MG tablet Take 2 tablets (650 mg total) by mouth every 6 (six) hours as needed for mild pain or headache (fever >/= 101).   albuterol  (VENTOLIN  HFA) 108 (90 Base) MCG/ACT inhaler Can inhale two puffs every four to six hours as needed for cough or wheeze.   Albuterol -Budesonide  (AIRSUPRA ) 90-80 MCG/ACT AERO Inhale 2 Inhalations into the lungs every 6 (six) hours as needed.   Albuterol -Budesonide  (AIRSUPRA ) 90-80 MCG/ACT AERO Inhale 2 Inhalations into the lungs every 6 (six) hours as needed   amLODipine  (NORVASC ) 5 MG tablet Take 5 mg by mouth at bedtime.    benralizumab  (FASENRA  PEN) 30 MG/ML prefilled autoinjector Inject 1 mL (30 mg total) into the skin every 8 (eight) weeks.   budesonide -glycopyrrolate-formoterol (BREZTRI  AEROSPHERE) 160-9-4.8 MCG/ACT AERO inhaler Inhale two puffs twice daily to prevent cough or wheeze. Rinse, gargle, and spit after use.   fexofenadine (ALLEGRA) 180 MG tablet Take 180 mg by mouth daily.   meloxicam (MOBIC) 15 MG tablet Take 15 mg by mouth daily.   mometasone  (NASONEX ) 50 MCG/ACT nasal spray Use one spray in each nostril once or twice daily (Patient taking differently: Place 1 spray into the nose at bedtime.)   montelukast  (SINGULAIR ) 10 MG tablet Take by mouth.   pantoprazole  (PROTONIX ) 40 MG tablet Take one tablet by mouth twice daily as directed.   pantoprazole  (PROTONIX ) 40 MG tablet Take 1 tablet (40 mg total) by mouth 2 (two)  times daily as directed.   Water For Injection Sterile (STERILE WATER, PRESERVATIVE FREE,) injection USE AS DIRECTED   Facility-Administered Encounter Medications as of 11/05/2023  Medication   Mepolizumab  SOLR 100 mg     There is no immunization history for the selected administration types on file for this patient.   PFTs     No data to display          Assessment   Biologics training for benralizumab  (Fasenra )  Goals of therapy: Mechanism of action: humanized afucosylated, monoclonal antibody (IgG1, kappa) that binds to the alpha subunit of the interleukin-5 receptor. IL-5 is the major cytokine responsible for the growth and differentiation, recruitment, activation, and survival of eosinophils (a cell type associated with inflammation and an important component in the pathogenesis of asthma) Reviewed that Fasenra  is add-on medication and patient must continue maintenance inhaler regimen. Response to therapy: may take 4 months to determine efficacy. Discussed that patients generally feel improvement sooner than 4 months.  Side effects: antibody development (13%), headache (8%), pharyngitis (5%), injection site reaction (2.2%)  Dose: 30 mg subcutaneously at Week 0, Week 4, Week 8, then 30mg  every 8 weeks thereafter  Administration/Storage:   Reviewed administration sites of thigh or abdomen (at least 2-3 inches away from abdomen). Reviewed the upper arm is only appropriate if caregiver is administering injection  Do not shake the pen/syringe as this could lead to product foaming or precipitation. \ Reviewed storage of medication in refrigerator. Reviewed that Fasenra  can be stored at room temperature in  unopened carton for up to 14 days.  Side effects: headache (19%), injection site reaction (7-15%), antibody development (6%), backache (5%), fatigue (5%)  Access: Approval of Fasenra  through: insurance Patient enrolled into copay card program to help with copay  assistance.  Medication Reconciliation  A drug regimen assessment was performed, including review of allergies, interactions, disease-state management, dosing and immunization history. Medications were reviewed with the patient, including name, instructions, indication, goals of therapy, potential side effects, importance of adherence, and safe use.  Drug interaction(s):  none noted  Immunizations  Patient is indicated for the influenzae, pneumonia, and shingles vaccinations.   PLAN Continue Fasenra  30mg  every 8 weeks. Rx sent to: [facility]: [phone]   All questions encouraged and answered.  Instructed patient to reach out with any further questions or concerns.  Thank you for allowing pharmacy to participate in this patient's care.  This appointment required 30 minutes of patient care (this includes precharting, chart review, review of results, face-to-face care, etc.).  Herlene Fleeta Morris, PharmD, JAQUELINE, CPP Clinical Pharmacist [facility] [phone]

## 2023-11-05 NOTE — Progress Notes (Signed)
 Specialty Pharmacy Initial Fill Coordination Note  Nicolas Willis is a 58 y.o. male contacted today regarding initial fill of specialty medication(s) Benralizumab  (Fasenra  Pen)   Patient requested Delivery   Delivery date: 11/06/23   Verified address: [address]   Medication will be filled on 8/21.   Patient is aware of $0 copayment.

## 2023-11-05 NOTE — Progress Notes (Signed)
 Please see OV from 11/05/2023 for complete documentation.   Nicolas Willis, PharmD, Nicolas Willis, CPP Clinical Pharmacist [facility] [phone]

## 2023-11-18 DIAGNOSIS — D2239 Melanocytic nevi of other parts of face: Secondary | ICD-10-CM | POA: Diagnosis not present

## 2023-11-18 DIAGNOSIS — D225 Melanocytic nevi of trunk: Secondary | ICD-10-CM | POA: Diagnosis not present

## 2023-11-18 DIAGNOSIS — L814 Other melanin hyperpigmentation: Secondary | ICD-10-CM | POA: Diagnosis not present

## 2023-11-18 DIAGNOSIS — L821 Other seborrheic keratosis: Secondary | ICD-10-CM | POA: Diagnosis not present

## 2023-11-30 ENCOUNTER — Other Ambulatory Visit (HOSPITAL_COMMUNITY): Payer: Self-pay

## 2023-12-17 ENCOUNTER — Other Ambulatory Visit: Payer: Self-pay | Admitting: Allergy and Immunology

## 2023-12-17 ENCOUNTER — Other Ambulatory Visit: Payer: Self-pay

## 2023-12-17 ENCOUNTER — Other Ambulatory Visit (HOSPITAL_COMMUNITY): Payer: Self-pay

## 2023-12-18 ENCOUNTER — Other Ambulatory Visit (HOSPITAL_COMMUNITY): Payer: Self-pay

## 2023-12-22 ENCOUNTER — Other Ambulatory Visit (HOSPITAL_COMMUNITY): Payer: Self-pay

## 2023-12-22 ENCOUNTER — Other Ambulatory Visit: Payer: Self-pay

## 2023-12-22 MED ORDER — MONTELUKAST SODIUM 10 MG PO TABS
10.0000 mg | ORAL_TABLET | Freq: Every evening | ORAL | 3 refills | Status: AC
  Filled 2023-12-22 (×2): qty 90, 90d supply, fill #0
  Filled 2024-03-20: qty 90, 90d supply, fill #1

## 2023-12-22 MED ORDER — AMLODIPINE BESYLATE 10 MG PO TABS
10.0000 mg | ORAL_TABLET | Freq: Every day | ORAL | 3 refills | Status: AC
  Filled 2023-12-22 (×2): qty 90, 90d supply, fill #0
  Filled 2024-03-20: qty 90, 90d supply, fill #1

## 2023-12-23 ENCOUNTER — Other Ambulatory Visit (HOSPITAL_COMMUNITY): Payer: Self-pay

## 2023-12-24 ENCOUNTER — Other Ambulatory Visit (HOSPITAL_COMMUNITY): Payer: Self-pay

## 2023-12-24 ENCOUNTER — Other Ambulatory Visit: Payer: Self-pay

## 2023-12-24 ENCOUNTER — Other Ambulatory Visit: Payer: Self-pay | Admitting: Pharmacy Technician

## 2023-12-25 ENCOUNTER — Other Ambulatory Visit: Payer: Self-pay

## 2023-12-28 ENCOUNTER — Other Ambulatory Visit: Payer: Self-pay

## 2023-12-29 ENCOUNTER — Other Ambulatory Visit (HOSPITAL_COMMUNITY): Payer: Self-pay

## 2023-12-30 ENCOUNTER — Other Ambulatory Visit: Payer: Self-pay

## 2023-12-30 ENCOUNTER — Other Ambulatory Visit (HOSPITAL_COMMUNITY): Payer: Self-pay

## 2023-12-30 NOTE — Progress Notes (Signed)
 Specialty Pharmacy Refill Coordination Note  Nicolas Willis is a 58 y.o. male contacted today regarding refills of specialty medication(s) Benralizumab  (Fasenra  Pen)   Patient requested Delivery   Delivery date: 01/01/24   Verified address: [address]   [place] [address]   Medication will be filled on 12/31/23.

## 2023-12-31 ENCOUNTER — Other Ambulatory Visit: Payer: Self-pay

## 2024-01-29 ENCOUNTER — Telehealth: Payer: Self-pay | Admitting: Allergy and Immunology

## 2024-01-29 NOTE — Telephone Encounter (Signed)
 Left voicemail to give the office a call back to schedule [facility] reapproval appointment.

## 2024-02-08 ENCOUNTER — Encounter: Payer: Self-pay | Admitting: Allergy and Immunology

## 2024-02-08 ENCOUNTER — Other Ambulatory Visit (HOSPITAL_COMMUNITY): Payer: Self-pay

## 2024-02-08 ENCOUNTER — Ambulatory Visit: Admitting: Allergy and Immunology

## 2024-02-08 VITALS — BP 152/92 | HR 86 | Resp 16 | Ht 69.5 in | Wt 227.6 lb

## 2024-02-08 DIAGNOSIS — K219 Gastro-esophageal reflux disease without esophagitis: Secondary | ICD-10-CM | POA: Diagnosis not present

## 2024-02-08 DIAGNOSIS — J4550 Severe persistent asthma, uncomplicated: Secondary | ICD-10-CM | POA: Diagnosis not present

## 2024-02-08 DIAGNOSIS — J3089 Other allergic rhinitis: Secondary | ICD-10-CM

## 2024-02-08 DIAGNOSIS — J339 Nasal polyp, unspecified: Secondary | ICD-10-CM

## 2024-02-08 MED ORDER — PANTOPRAZOLE SODIUM 40 MG PO TBEC
40.0000 mg | DELAYED_RELEASE_TABLET | Freq: Two times a day (BID) | ORAL | 1 refills | Status: AC
  Filled 2024-02-08 – 2024-03-20 (×2): qty 180, 90d supply, fill #0

## 2024-02-08 NOTE — Progress Notes (Unsigned)
 [place] - [place] - [place] - [place] - [place]   Follow-up Note  Referring Provider: Silver Lamar LABOR, MD Primary Provider: Silver Lamar LABOR, MD Date of Office Visit: 02/08/2024  Subjective:   Nicolas Willis (DOB: 19-Jun-1965) is a 58 y.o. male who [facility] in re-evaluation of the following:  HPI: Nicolas Willis returns to this clinic in evaluation of asthma, allergic rhinitis, nasal polyposis, LPR.  I last saw him in this clinic for November 2024.  As long as he remains on Mepolizumab  injections his asthma has been under excellent control and he rarely uses any Breztri  and has had no need to use the short acting bronchodilator and can exercise with any problem.  As long as he remains on mepolizumab  has had very little problem with his upper airway and he can smell and taste with no problem and he continues on montelukast  on a consistent basis and occasionally some nasal steroids.  He has been having lots of throat clearing.  He has decreased his Protonix  to once a day.  He drinks 3 cups of coffee per day with 1 in the morning 1 in the afternoon and 1 at night.  Allergies as of 02/08/2024   No Known Allergies      Medication List    acetaminophen  325 MG tablet Commonly known as: TYLENOL  Take 2 tablets (650 mg total) by mouth every 6 (six) hours as needed for mild pain or headache (fever >/= 101).   Airsupra  90-80 MCG/ACT Aero Generic drug: Albuterol -Budesonide  Inhale 2 Inhalations into the lungs every 6 (six) hours as needed.   Airsupra  90-80 MCG/ACT Aero Generic drug: Albuterol -Budesonide  Inhale 2 Inhalations into the lungs every 6 (six) hours as needed   albuterol  108 (90 Base) MCG/ACT inhaler Commonly known as: VENTOLIN  HFA Can inhale two puffs every four to six hours as needed for cough or wheeze.   amLODipine  5 MG tablet Commonly known as: NORVASC  Take 5 mg by mouth at bedtime.   amLODipine  10 MG  tablet Commonly known as: NORVASC  Take 1 tablet (10 mg total) by mouth daily.   Breztri  Aerosphere 160-9-4.8 MCG/ACT Aero inhaler Generic drug: budesonide -glycopyrrolate-formoterol Inhale two puffs twice daily to prevent cough or wheeze. Rinse, gargle, and spit after use.   Fasenra  Pen 30 MG/ML prefilled autoinjector Generic drug: benralizumab  Inject 1 mL (30 mg total) into the skin every 8 (eight) weeks.   fexofenadine 180 MG tablet Commonly known as: ALLEGRA Take 180 mg by mouth daily.   meloxicam 15 MG tablet Commonly known as: MOBIC Take 15 mg by mouth daily.   mometasone  50 MCG/ACT nasal spray Commonly known as: Nasonex  Use one spray in each nostril once or twice daily   montelukast  10 MG tablet Commonly known as: SINGULAIR  Take by mouth.   montelukast  10 MG tablet Commonly known as: SINGULAIR  Take 1 tablet (10 mg total) by mouth Nightly.   pantoprazole  40 MG tablet Commonly known as: PROTONIX  Take one tablet by mouth twice daily as directed.   pantoprazole  40 MG tablet Commonly known as: PROTONIX  Take 1 tablet (40 mg total) by mouth 2 (two) times daily as directed.   sterile water (preservative free) injection USE AS DIRECTED    Past Medical History:  Diagnosis Date   Asthma    Frequent sinus infections     Past Surgical History:  Procedure Laterality Date   SINOSCOPY      Review of systems negative except as noted in HPI /  PMHx or noted below:  Review of Systems  Constitutional: Negative.   HENT: Negative.    Eyes: Negative.   Respiratory: Negative.    Cardiovascular: Negative.   Gastrointestinal: Negative.   Genitourinary: Negative.   Musculoskeletal: Negative.   Skin: Negative.   Neurological: Negative.   Endo/Heme/Allergies: Negative.   Psychiatric/Behavioral: Negative.       Objective:   There were no vitals filed for this visit.        Physical Exam Constitutional:      Appearance: He is not diaphoretic.  HENT:     Head:  Normocephalic.     Right Ear: Tympanic membrane, ear canal and external ear normal.     Left Ear: Tympanic membrane, ear canal and external ear normal.     Nose: Nose normal. No mucosal edema or rhinorrhea.     Mouth/Throat:     Pharynx: Uvula midline. No oropharyngeal exudate.  Eyes:     Conjunctiva/sclera: Conjunctivae normal.  Neck:     Thyroid: No thyromegaly.     Trachea: Trachea normal. No tracheal tenderness or tracheal deviation.  Cardiovascular:     Rate and Rhythm: Normal rate and regular rhythm.     Heart sounds: Normal heart sounds, S1 normal and S2 normal. No murmur heard. Pulmonary:     Effort: No respiratory distress.     Breath sounds: Normal breath sounds. No stridor. No wheezing or rales.  Lymphadenopathy:     Head:     Right side of head: No tonsillar adenopathy.     Left side of head: No tonsillar adenopathy.     Cervical: No cervical adenopathy.  Skin:    Findings: No erythema or rash.     Nails: There is no clubbing.  Neurological:     Mental Status: He is alert.     Diagnostics: Spirometry was performed and demonstrated an FEV1 of 3.62 at 99 % of predicted.   Assessment and Plan:   1. Asthma, severe persistent, well-controlled (HCC)   2. Other allergic rhinitis   3. Nasal polyposis   4. LPRD (laryngopharyngeal reflux disease)      1. Continue to perform Allergen avoidance measures as best as possible  2. Continue to Treat and prevent inflammation:   A. Nasonex  - 1 spray each nostril 1 time per day  B. montelukast  10 mg - 1 tablet 1 time per day  C. Breztri  - 2 inhalations 2 times per day (empty lungs)  D. Mepolizumab  injections   3. Treat and prevent reflux / LPR:   A. Protonix  40 mg - 1 tablet 2 times per day  B. Minimize caffeine consumption  C. Replace throat clearing with drinking / swallowing maneuver  4. If needed:   A. AIRSUPRA  -  2 inhalations every 6 hours (Coupon)  B. loratadine  10 mg one tablet one time per day  C. nasal  saline wash  5. Return to clinic in 12 months or earlier if needed  6. Influenza = tamiflu. Covid = Paxlovid  Nicolas Willis appears to be doing quite well and he will remain on Mepolizumab  injections and he has a selection of other anti-inflammatory agents to use for his airway as noted above.  He appears to have a little bit more problems with LPR and we will have him consistently use his Protonix  twice a day and he can minimize his caffeine consumption.  Assuming he does well I will see him back in this clinic in 1 year or earlier if there is  a problem.  Camellia Denis, MD Allergy / Immunology [facility]

## 2024-02-08 NOTE — Patient Instructions (Signed)
    1. Continue to perform Allergen avoidance measures as best as possible  2. Continue to Treat and prevent inflammation:   A. Nasonex  - 1 spray each nostril 1 time per day  B. montelukast  10 mg - 1 tablet 1 time per day  C. Breztri  - 2 inhalations 2 times per day (empty lungs)  D. Mepolizumab  injections   3. Treat and prevent reflux / LPR:   A. Protonix  40 mg - 1 tablet 2 times per day  B. Minimize caffeine consumption  C. Replace throat clearing with drinking / swallowing maneuver  4. If needed:   A. AIRSUPRA  -  2 inhalations every 6 hours (Coupon)  B. loratadine  10 mg one tablet one time per day  C. nasal saline wash  5. Return to clinic in 12 months or earlier if needed  6. Influenza = tamiflu. Covid = Paxlovid

## 2024-02-09 ENCOUNTER — Encounter: Payer: Self-pay | Admitting: Allergy and Immunology

## 2024-02-17 ENCOUNTER — Other Ambulatory Visit (HOSPITAL_COMMUNITY): Payer: Self-pay

## 2024-02-18 ENCOUNTER — Other Ambulatory Visit (HOSPITAL_COMMUNITY): Payer: Self-pay

## 2024-02-18 ENCOUNTER — Other Ambulatory Visit: Payer: Self-pay

## 2024-02-19 ENCOUNTER — Other Ambulatory Visit: Payer: Self-pay

## 2024-02-21 ENCOUNTER — Other Ambulatory Visit (HOSPITAL_COMMUNITY): Payer: Self-pay

## 2024-02-22 ENCOUNTER — Other Ambulatory Visit: Payer: Self-pay

## 2024-02-24 ENCOUNTER — Other Ambulatory Visit (HOSPITAL_COMMUNITY): Payer: Self-pay

## 2024-02-24 ENCOUNTER — Other Ambulatory Visit: Payer: Self-pay

## 2024-02-24 NOTE — Progress Notes (Signed)
 Specialty Pharmacy Refill Coordination Note  Nicolas Willis is a 58 y.o. male contacted today regarding refills of specialty medication(s) Benralizumab  (Fasenra  Pen)   Patient requested Delivery   Delivery date: 02/26/24   Verified address: [address]   [place] [address]   Medication will be filled on: 02/25/24

## 2024-02-25 ENCOUNTER — Other Ambulatory Visit: Payer: Self-pay

## 2024-03-18 ENCOUNTER — Other Ambulatory Visit (HOSPITAL_COMMUNITY): Payer: Self-pay

## 2024-03-18 ENCOUNTER — Other Ambulatory Visit: Payer: Self-pay

## 2024-03-21 ENCOUNTER — Other Ambulatory Visit (HOSPITAL_COMMUNITY): Payer: Self-pay

## 2024-03-21 ENCOUNTER — Other Ambulatory Visit: Payer: Self-pay

## 2024-03-22 NOTE — Progress Notes (Signed)
 Replacement product sent to patient on 01.06.26 for 01.07.26 delivery. Tracking [id number]

## 2024-03-24 ENCOUNTER — Other Ambulatory Visit (HOSPITAL_COMMUNITY): Payer: Self-pay

## 2024-04-14 ENCOUNTER — Other Ambulatory Visit: Payer: Self-pay

## 2024-04-14 ENCOUNTER — Other Ambulatory Visit: Payer: Self-pay | Admitting: Pharmacy Technician

## 2024-04-15 ENCOUNTER — Telehealth: Payer: Self-pay

## 2024-04-15 ENCOUNTER — Other Ambulatory Visit (HOSPITAL_COMMUNITY): Payer: Self-pay

## 2024-04-15 NOTE — Telephone Encounter (Signed)
 Per call center, Fasenra  needs PA. Thank you!

## 2024-04-18 ENCOUNTER — Other Ambulatory Visit: Payer: Self-pay

## 2024-04-20 ENCOUNTER — Other Ambulatory Visit: Payer: Self-pay | Admitting: Pharmacy Technician

## 2024-04-20 ENCOUNTER — Other Ambulatory Visit: Payer: Self-pay

## 2024-04-20 ENCOUNTER — Other Ambulatory Visit (HOSPITAL_COMMUNITY): Payer: Self-pay

## 2024-04-20 NOTE — Telephone Encounter (Signed)
 Processed for $0.00, nothing further needed.

## 2024-04-20 NOTE — Progress Notes (Signed)
 Specialty Pharmacy Refill Coordination Note  Nicolas Willis is a 59 y.o. male contacted today regarding refills of specialty medication(s) Benralizumab  (Fasenra  Pen)   Patient requested Delivery   Delivery date: 05/10/24   Verified address: [address]   [place] [address]   Medication will be filled on: 05/09/24
